# Patient Record
Sex: Male | Born: 1945 | Race: White | Hispanic: No | Marital: Married | Smoking: Former smoker
Health system: Southern US, Community
[De-identification: ages and names within clinical notes are randomized; demographics above are authoritative.]

## PROBLEM LIST (undated history)

## (undated) DIAGNOSIS — I10 Essential (primary) hypertension: Secondary | ICD-10-CM

## (undated) DIAGNOSIS — E7800 Pure hypercholesterolemia, unspecified: Secondary | ICD-10-CM

## (undated) HISTORY — PX: PROSTATE SURGERY: SHX751

---

## 2011-07-29 ENCOUNTER — Encounter (HOSPITAL_BASED_OUTPATIENT_CLINIC_OR_DEPARTMENT_OTHER): Payer: Self-pay | Admitting: *Deleted

## 2011-07-29 ENCOUNTER — Emergency Department (HOSPITAL_BASED_OUTPATIENT_CLINIC_OR_DEPARTMENT_OTHER)
Admission: EM | Admit: 2011-07-29 | Discharge: 2011-07-29 | Disposition: A | Discharge status: Home / Self Care | Payer: Managed Care, Other (non HMO) | Attending: Emergency Medicine | Admitting: Emergency Medicine

## 2011-07-29 DIAGNOSIS — K5641 Fecal impaction: Secondary | ICD-10-CM | POA: Insufficient documentation

## 2011-07-29 DIAGNOSIS — K644 Residual hemorrhoidal skin tags: Secondary | ICD-10-CM | POA: Insufficient documentation

## 2011-07-29 DIAGNOSIS — K625 Hemorrhage of anus and rectum: Secondary | ICD-10-CM

## 2011-07-29 MED ORDER — PHOSPHATE ENEMA 7-19 GM/118ML RE ENEM: 1.0000 | ENEMA | Freq: Once | RECTAL | Status: AC | PRN

## 2011-07-29 MED ORDER — FLEET ENEMA 7-19 GM/118ML RE ENEM
1.0000 | ENEMA | Freq: Once | RECTAL | Status: AC
  Administered 2011-07-29: 1 via RECTAL
  Filled 2011-07-29: qty 1

## 2011-07-29 MED ORDER — FLEET ENEMA 7-19 GM/118ML RE ENEM
1.0000 | ENEMA | Freq: Once | RECTAL | Status: DC
  Filled 2011-07-29: qty 1

## 2011-07-29 MED ORDER — DOCUSATE SODIUM 100 MG PO CAPS: 100.0000 mg | ORAL_CAPSULE | Freq: Two times a day (BID) | ORAL | Status: AC

## 2011-07-29 NOTE — ED Notes (Signed)
Pt presnts to ED today with rectal bleeding that began this am.  Pt has hx of same.

## 2011-07-29 NOTE — ED Notes (Signed)
MD at bedside attempting to disimpact pt.

## 2011-07-29 NOTE — ED Notes (Signed)
Pt presnets with BRBPR that started this am.  Pt took Miralaz last night and takes Hydrocodone on a regular basis.  Pt c.o mild abd pain and states that he "very uncomfortable"  Pt unable to sit in triage.

## 2011-07-29 NOTE — ED Provider Notes (Signed)
History     CSN: [id number]  Arrival date & time 07/29/11  7829   First MD Initiated Contact with Patient 07/29/11 [phone]      Chief Complaint  Patient presents with  . Rectal Bleeding    (Consider location/radiation/quality/duration/timing/severity/associated sxs/prior treatment) HPI  66yo distress hemorrhoids since with rectal bleeding and rectal pain. The patient states that he has rectal fullness. He complains of constipation. His last bowel movement was yesterday which he describes as tiny pebbles. He states he woke up this morning with bright red blood dripping into the toilet after he was straining to use the bathroom. He states his rectal fullness became worse. He denies abdominal pain, bloating. He is passing gas. He takes hydrocodone daily which she states is for diffuse body aches related to working out every day. He has taken for approximately one year. He describes a low fiber diet. Denies h/o obstruction. Denies N/V  Past Medical History  Diagnosis Date  . Hemorrhoids     Past Surgical History  Procedure Date  . Prostate surgery     History reviewed. No pertinent family history.  History  Substance Use Topics  . Smoking status: Not on file  . Smokeless tobacco: Not on file  . Alcohol Use:     OB History    Grav Para Term Preterm Abortions TAB SAB Ect Mult Living                  Review of Systems  All other systems reviewed and are negative.  except as noted HPI   Allergies  Review of patient's allergies indicates no known allergies.  Home Medications   Current Outpatient Rx  Name Route Sig Dispense Refill  . HYDROCODONE-ACETAMINOPHEN 10-325 MG PO TABS Oral Take 1 tablet by mouth every 6 (six) hours as needed.    Marland Kitchen DOCUSATE SODIUM 100 MG PO CAPS Oral Take 1 capsule (100 mg total) by mouth every 12 (twelve) hours. 60 capsule 0  . PHOSPHATE ENEMA 7-19 GM/118ML RE ENEM Rectal Place 1 enema rectally once as needed for constipation. 135 mL 0    BP  148/95  Pulse 98  Temp 97.8 F (36.6 C)  Resp 20  SpO2 100%  Physical Exam  Nursing note and vitals reviewed. Constitutional: She is oriented to person, place, and time. She appears well-developed.  HENT:  Head: Atraumatic.  Mouth/Throat: Oropharynx is clear and moist.  Eyes: Conjunctivae and EOM are normal. Pupils are equal, round, and reactive to light.  Neck: Normal range of motion. Neck supple.  Cardiovascular: Normal rate, regular rhythm, normal heart sounds and intact distal pulses.   Pulmonary/Chest: Effort normal and breath sounds normal. No respiratory distress. She has no wheezes. She has no rales.  Abdominal: Soft. She exhibits no distension. There is no tenderness. There is no rebound and no guarding.  Genitourinary:       +bleeding ext hemorrhoid no anal fissure +impacted stool rectal vault  Musculoskeletal: Normal range of motion.  Neurological: She is alert and oriented to person, place, and time.  Skin: Skin is warm and dry. No rash noted.  Psychiatric: She has a normal mood and affect.    ED Course  Procedures (including critical care time)  Stool disimpaction  Patient in R lateral recumbent position Stool gently removed digitally from rectal vault  Labs Reviewed - No data to display No results found.   1. Fecal impaction   2. Rectal bleeding   3. External hemorrhoid  MDM  Impacted stool s/p enema and disimpaction. Large BM in ED and feeling better. Will discharge home with colace, fleet enema, pmd f/u to discuss chronic narcotic use.         Forbes Cellar, MD 07/29/11 [phone]

## 2011-07-29 NOTE — ED Notes (Signed)
Pt sts he was able to have significant bowel movement and "feels much better". Pt dressed and ready to go home.

## 2014-03-19 ENCOUNTER — Emergency Department (HOSPITAL_BASED_OUTPATIENT_CLINIC_OR_DEPARTMENT_OTHER): Payer: BC Managed Care – PPO

## 2014-03-19 ENCOUNTER — Emergency Department (HOSPITAL_BASED_OUTPATIENT_CLINIC_OR_DEPARTMENT_OTHER)
Admission: EM | Admit: 2014-03-19 | Discharge: 2014-03-19 | Disposition: A | Discharge status: Home / Self Care | Payer: BC Managed Care – PPO | Attending: Emergency Medicine | Admitting: Emergency Medicine

## 2014-03-19 ENCOUNTER — Encounter (HOSPITAL_BASED_OUTPATIENT_CLINIC_OR_DEPARTMENT_OTHER): Payer: Self-pay | Admitting: Emergency Medicine

## 2014-03-19 DIAGNOSIS — M1120 Other chondrocalcinosis, unspecified site: Secondary | ICD-10-CM | POA: Diagnosis not present

## 2014-03-19 DIAGNOSIS — Z8679 Personal history of other diseases of the circulatory system: Secondary | ICD-10-CM | POA: Insufficient documentation

## 2014-03-19 DIAGNOSIS — M659 Unspecified synovitis and tenosynovitis, unspecified site: Secondary | ICD-10-CM | POA: Insufficient documentation

## 2014-03-19 DIAGNOSIS — Z87891 Personal history of nicotine dependence: Secondary | ICD-10-CM | POA: Insufficient documentation

## 2014-03-19 DIAGNOSIS — M79609 Pain in unspecified limb: Secondary | ICD-10-CM | POA: Diagnosis present

## 2014-03-19 DIAGNOSIS — M779 Enthesopathy, unspecified: Secondary | ICD-10-CM

## 2014-03-19 LAB — D-DIMER, QUANTITATIVE: D-Dimer, Quant: 0.32 ug/mL-FEU (ref 0.00–0.48)

## 2014-03-19 LAB — CBC WITH DIFFERENTIAL/PLATELET
BASOS ABS: 0 10*3/uL (ref 0.0–0.1)
Basophils Relative: 0 % (ref 0–1)
EOS PCT: 3 % (ref 0–5)
Eosinophils Absolute: 0.2 10*3/uL (ref 0.0–0.7)
HEMATOCRIT: 37.7 % — AB (ref 39.0–52.0)
Hemoglobin: 12.4 g/dL — ABNORMAL LOW (ref 13.0–17.0)
LYMPHS PCT: 32 % (ref 12–46)
Lymphs Abs: 2.2 10*3/uL (ref 0.7–4.0)
MCH: 29.5 pg (ref 26.0–34.0)
MCHC: 32.9 g/dL (ref 30.0–36.0)
MCV: 89.5 fL (ref 78.0–100.0)
MONO ABS: 0.6 10*3/uL (ref 0.1–1.0)
Monocytes Relative: 9 % (ref 3–12)
Neutro Abs: 3.8 10*3/uL (ref 1.7–7.7)
Neutrophils Relative %: 56 % (ref 43–77)
Platelets: 305 10*3/uL (ref 150–400)
RBC: 4.21 MIL/uL — ABNORMAL LOW (ref 4.22–5.81)
RDW: 12.9 % (ref 11.5–15.5)
WBC: 6.8 10*3/uL (ref 4.0–10.5)

## 2014-03-19 MED ORDER — METHOCARBAMOL 500 MG PO TABS
1000.0000 mg | ORAL_TABLET | Freq: Once | ORAL | Status: AC
  Administered 2014-03-19: 1000 mg via ORAL
  Filled 2014-03-19: qty 2

## 2014-03-19 MED ORDER — MELOXICAM 15 MG PO TABS: 15.0000 mg | ORAL_TABLET | Freq: Every day | ORAL | Status: DC

## 2014-03-19 MED ORDER — PREDNISONE 10 MG PO TABS: 20.0000 mg | ORAL_TABLET | Freq: Every day | ORAL | Status: DC

## 2014-03-19 NOTE — ED Notes (Signed)
Rt upper leg pain in front radiating into shin,  No known inj, no bruising denies swelling

## 2014-03-19 NOTE — ED Notes (Signed)
Rt upper leg pain x 4 days  Radiating into shin,  Denies inj, swelling or bruising

## 2014-03-19 NOTE — ED Provider Notes (Signed)
CSN: [id number]     Arrival date & time 03/19/14  0500 History   First MD Initiated Contact with Patient 03/19/14 [phone]     Chief Complaint  Patient presents with  . Leg Pain     (Consider location/radiation/quality/duration/timing/severity/associated sxs/prior Treatment) Patient is a 68 y.o. male presenting with leg pain. The history is provided by the patient. No language interpreter was used.  Leg Pain Lower extremity pain location: thigh now to the shin anteriorly. Lower extremity injury: works out daily but doesn't think it affected it.   Pain details:    Quality:  Aching   Radiates to:  Does not radiate   Severity:  Severe   Onset quality:  Gradual   Timing:  Constant   Progression:  Unchanged Chronicity:  New Dislocation: no   Foreign body present:  No foreign bodies Relieved by:  Nothing Worsened by:  Nothing tried Ineffective treatments:  None tried Associated symptoms: no back pain, no decreased ROM, no muscle weakness, no stiffness and no swelling   Risk factors: no concern for non-accidental trauma     Past Medical History  Diagnosis Date  . Hemorrhoids    Past Surgical History  Procedure Laterality Date  . Prostate surgery     No family history on file. History  Substance Use Topics  . Smoking status: Former Games developer  . Smokeless tobacco: Not on file  . Alcohol Use: No    Review of Systems  Musculoskeletal: Negative for back pain and stiffness.  All other systems reviewed and are negative.     Allergies  Review of patient's allergies indicates no known allergies.  Home Medications   Prior to Admission medications   Medication Sig Start Date End Date Taking? Authorizing Provider  HYDROcodone-acetaminophen (NORCO) 10-325 MG per tablet Take 1 tablet by mouth every 6 (six) hours as needed.    Historical Provider, MD   BP 146/87  Pulse 79  Temp(Src) 98.3 F (36.8 C) (Oral)  Resp 18  Ht 6' (1.829 m)  Wt 200 lb (90.719 kg)  BMI 27.12 kg/m2   SpO2 97% Physical Exam  Constitutional: He is oriented to person, place, and time. He appears well-developed and well-nourished. No distress.  HENT:  Head: Normocephalic and atraumatic.  Mouth/Throat: Oropharynx is clear and moist.  Eyes: Conjunctivae are normal. Pupils are equal, round, and reactive to light.  Neck: Normal range of motion. Neck supple.  Cardiovascular: Normal rate and intact distal pulses.   Pulmonary/Chest: Effort normal and breath sounds normal. He has no wheezes. He has no rales.  Abdominal: Soft. Bowel sounds are normal. There is no tenderness. There is no rebound and no guarding.  Musculoskeletal: Normal range of motion. He exhibits no edema and no tenderness.       Right knee: He exhibits normal range of motion, no swelling, no effusion, no ecchymosis, no deformity, no laceration, no erythema, normal alignment, no LCL laxity, normal patellar mobility, no bony tenderness, normal meniscus and no MCL laxity. No tenderness found. No medial joint line, no lateral joint line, no MCL, no LCL and no patellar tendon tenderness noted.       Right upper leg: Normal. He exhibits no tenderness, no bony tenderness, no swelling, no edema, no deformity and no laceration.       Right lower leg: Normal. He exhibits no tenderness, no bony tenderness, no swelling, no edema, no deformity and no laceration.  Negative anterior and posterior drawer tests all compartments of the upper and lower  leg are soft 5/5 RLE strength 3+ dorsalis pedis on the right.    Neurological: He is alert and oriented to person, place, and time. He has normal reflexes.  Skin: Skin is warm and dry.  Psychiatric: He has a normal mood and affect.    ED Course  Procedures (including critical care time) Labs Review Labs Reviewed  CBC WITH DIFFERENTIAL - Abnormal; Notable for the following:    RBC 4.21 (*)    Hemoglobin 12.4 (*)    HCT 37.7 (*)    All other components within normal limits  D-DIMER, QUANTITATIVE     Imaging Review No results found.   EKG Interpretation None      MDM   Final diagnoses:  None   Negative ddimer, very lower risk for DVT.  Muscle strain.  Will treat with muscle relaxants ice and elevation and close follow up with your PMD   Glory Graefe K Asa Fath-Rasch, MD 03/19/14 0600

## 2014-03-21 ENCOUNTER — Emergency Department (HOSPITAL_BASED_OUTPATIENT_CLINIC_OR_DEPARTMENT_OTHER)
Admission: EM | Admit: 2014-03-21 | Discharge: 2014-03-21 | Disposition: A | Discharge status: Home / Self Care | Payer: Medicare Other | Attending: Emergency Medicine | Admitting: Emergency Medicine

## 2014-03-21 ENCOUNTER — Encounter (HOSPITAL_BASED_OUTPATIENT_CLINIC_OR_DEPARTMENT_OTHER): Payer: Self-pay | Admitting: Emergency Medicine

## 2014-03-21 DIAGNOSIS — IMO0002 Reserved for concepts with insufficient information to code with codable children: Secondary | ICD-10-CM | POA: Insufficient documentation

## 2014-03-21 DIAGNOSIS — M79604 Pain in right leg: Secondary | ICD-10-CM

## 2014-03-21 DIAGNOSIS — M79609 Pain in unspecified limb: Secondary | ICD-10-CM | POA: Insufficient documentation

## 2014-03-21 DIAGNOSIS — Z791 Long term (current) use of non-steroidal anti-inflammatories (NSAID): Secondary | ICD-10-CM | POA: Insufficient documentation

## 2014-03-21 DIAGNOSIS — Z8679 Personal history of other diseases of the circulatory system: Secondary | ICD-10-CM | POA: Insufficient documentation

## 2014-03-21 DIAGNOSIS — Z87891 Personal history of nicotine dependence: Secondary | ICD-10-CM | POA: Insufficient documentation

## 2014-03-21 MED ORDER — NAPROXEN 375 MG PO TABS: 375.0000 mg | ORAL_TABLET | Freq: Two times a day (BID) | ORAL | Status: DC

## 2014-03-21 MED ORDER — HYDROCODONE-ACETAMINOPHEN 5-325 MG PO TABS: 1.0000 | ORAL_TABLET | ORAL | Status: DC | PRN

## 2014-03-21 NOTE — ED Notes (Signed)
Patient states that he started to hurt again with teh same pain that he had a few night ago. The pain was so bad it woke him up in the middle of the night. The patient reports that he is having pain to his upper leg and lower leg.

## 2014-03-21 NOTE — Discharge Instructions (Signed)
Followup with your own doctor to discuss formal ultrasound to look for small blood clot. Followup with local orthopedic for assessment of pain. For severe pain take norco or vicodin however realize they have the potential for addiction and it can make you sleepy and has tylenol in it.  No operating machinery while taking.  If you were given medicines take as directed.  If you are on coumadin or contraceptives realize their levels and effectiveness is altered by many different medicines.  If you have any reaction (rash, tongues swelling, other) to the medicines stop taking and see a physician.   Please follow up as directed and return to the ER or see a physician for new or worsening symptoms.  Thank you. Filed Vitals:   03/21/14 0339  BP: 155/82  Pulse: 74  Temp: 97.7 F (36.5 C)  TempSrc: Oral  Resp: 18  Weight: 200 lb (90.719 kg)  SpO2: 100%

## 2014-03-21 NOTE — ED Provider Notes (Signed)
CSN: [id number]     Arrival date & time 03/21/14  0331 History   First MD Initiated Contact with Patient 03/21/14 [phone]     Chief Complaint  Patient presents with  . Leg Pain     (Consider location/radiation/quality/duration/timing/severity/associated sxs/prior Treatment) HPI Comments: 68 year old male with no significant medical history presents with recurrent right upper and lower leg pain that woke him up at night. Patient was seen recently and had x-ray, d-dimer and CBC which were all unremarkable. Patient said the medicines that were given to improve pain significantly however tonight the pain returned. Patient denies any vascular history, no vascular risk factors, no recent travel, no blood clot history, no leg swelling, no active cancer, no shortness of breath or chest pain, no fevers or chills, no cold sensation in his leg, no back pain or leg weakness.  Patient is a 68 y.o. male presenting with leg pain. The history is provided by the patient.  Leg Pain Associated symptoms: no back pain, no fever and no neck pain     Past Medical History  Diagnosis Date  . Hemorrhoids    Past Surgical History  Procedure Laterality Date  . Prostate surgery     History reviewed. No pertinent family history. History  Substance Use Topics  . Smoking status: Former Games developer  . Smokeless tobacco: Not on file  . Alcohol Use: No    Review of Systems  Constitutional: Negative for fever and chills.  HENT: Negative for congestion.   Eyes: Negative for visual disturbance.  Respiratory: Negative for shortness of breath.   Cardiovascular: Negative for chest pain and leg swelling.  Gastrointestinal: Negative for vomiting and abdominal pain.  Genitourinary: Negative for dysuria and flank pain.  Musculoskeletal: Negative for back pain, neck pain and neck stiffness.  Skin: Negative for rash.  Neurological: Negative for light-headedness and headaches.      Allergies  Review of patient's allergies  indicates no known allergies.  Home Medications   Prior to Admission medications   Medication Sig Start Date End Date Taking? Authorizing Provider  HYDROcodone-acetaminophen (NORCO) 10-325 MG per tablet Take 1 tablet by mouth every 6 (six) hours as needed.    Historical Provider, MD  HYDROcodone-acetaminophen (NORCO) 5-325 MG per tablet Take 1-2 tablets by mouth every 4 (four) hours as needed. 03/21/14   Enid Skeens, MD  meloxicam (MOBIC) 15 MG tablet Take 1 tablet (15 mg total) by mouth daily. 03/19/14   April K Palumbo-Rasch, MD  naproxen (NAPROSYN) 375 MG tablet Take 1 tablet (375 mg total) by mouth 2 (two) times daily. 03/21/14   Enid Skeens, MD  predniSONE (DELTASONE) 10 MG tablet Take 2 tablets (20 mg total) by mouth daily. 03/19/14   April K Palumbo-Rasch, MD   BP 155/82  Pulse 74  Temp(Src) 97.7 F (36.5 C) (Oral)  Resp 18  Wt 200 lb (90.719 kg)  SpO2 100% Physical Exam  Nursing note and vitals reviewed. Constitutional: He is oriented to person, place, and time. He appears well-developed and well-nourished.  HENT:  Head: Normocephalic and atraumatic.  Eyes: Right eye exhibits no discharge. Left eye exhibits no discharge.  Neck: Normal range of motion. Neck supple. No tracheal deviation present.  Cardiovascular: Normal rate.   Pulmonary/Chest: Effort normal.  Abdominal: Soft.  Musculoskeletal: He exhibits tenderness. He exhibits no edema.  Patient has mild tenderness medial aspect of right proximal thigh and anterior proximal tibia without signs of infection. Patient has no tenderness with hip flexion or thigh  adduction. Patient has 2+ dorsalis pedis and posterior tibial pulses on the right leg. No calf tenderness. No knee swelling, full range of motion of the knee without instability.  Neurological: He is alert and oriented to person, place, and time.  Skin: Skin is warm. No rash noted.  Psychiatric: He has a normal mood and affect.    ED Course  Procedures (including  critical care time) Emergency Ultrasound Study:  Limited Duplex of right lower extremity veins  Performed by Dr. Jodi Mourning Indication: leg pain and/or swelling Visualization of saphenous-femoral junction, proximal femoral vein and popliteal vein regions in transverse plane with full compression visualized.  Interpretation  no dvt visualized.  Images archived electronically.       Labs Review Labs Reviewed - No data to display  Imaging Review Dg Knee Complete 4 Views Right  03/19/2014   CLINICAL DATA:  Right knee pain for 4 days.  EXAM: RIGHT KNEE - COMPLETE 4+ VIEW  COMPARISON:  None.  FINDINGS: Degenerative changes in the right knee with calcification in the medial and lateral compartments consistent with chondrocalcinosis. No evidence of acute fracture or dislocation. No focal bone lesion or bone destruction. No significant effusion.  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  No acute bony abnormalities.   Electronically Signed   By: Burman Nieves M.D.   On: 03/19/2014 06:25     EKG Interpretation None      MDM   Final diagnoses:  Right leg pain   Patient with recurrent right thigh and anterior tibia tenderness. Patient is very low risk blood clot and low risk vascular. No signs of acute ischemia, bedside ultrasound done and no significant blood clot seen full compression at multiple points along femoral vein, negative d-dimer yesterday. Possibly musculoskeletal in origin. Discussed having formal ultrasound done outpatient and reassessment by primary Dr. tomorrow. Few pain meds for home. Results and differential diagnosis were discussed with the patient/parent/guardian. Close follow up outpatient was discussed, comfortable with the plan.   Medications - No data to display  Filed Vitals:   03/21/14 0339  BP: 155/82  Pulse: 74  Temp: 97.7 F (36.5 C)  TempSrc: Oral  Resp: 18  Weight: 200 lb (90.719 kg)  SpO2: 100%    Final diagnoses:  Right leg pain         Enid Skeens, MD 03/21/14 [phone]

## 2017-06-04 ENCOUNTER — Other Ambulatory Visit: Payer: Self-pay

## 2017-06-04 ENCOUNTER — Encounter (HOSPITAL_BASED_OUTPATIENT_CLINIC_OR_DEPARTMENT_OTHER): Payer: Self-pay | Admitting: *Deleted

## 2017-06-04 ENCOUNTER — Emergency Department (HOSPITAL_BASED_OUTPATIENT_CLINIC_OR_DEPARTMENT_OTHER)
Admission: EM | Admit: 2017-06-04 | Discharge: 2017-06-04 | Disposition: A | Discharge status: Home / Self Care | Payer: BLUE CROSS/BLUE SHIELD | Attending: Emergency Medicine | Admitting: Emergency Medicine

## 2017-06-04 DIAGNOSIS — Z5321 Procedure and treatment not carried out due to patient leaving prior to being seen by health care provider: Secondary | ICD-10-CM | POA: Insufficient documentation

## 2017-06-04 DIAGNOSIS — R51 Headache: Secondary | ICD-10-CM | POA: Insufficient documentation

## 2017-06-04 HISTORY — DX: Essential (primary) hypertension: I10

## 2017-06-04 NOTE — ED Triage Notes (Signed)
Pt c/o h/a and increased BP x 2 days

## 2017-06-04 NOTE — ED Notes (Signed)
Pt called to treatment room with no answer from lobby.  

## 2017-06-04 NOTE — ED Notes (Signed)
Called to treatment room with no answer from lobby 

## 2017-06-11 ENCOUNTER — Encounter (HOSPITAL_BASED_OUTPATIENT_CLINIC_OR_DEPARTMENT_OTHER): Payer: Self-pay | Admitting: Emergency Medicine

## 2017-06-11 ENCOUNTER — Other Ambulatory Visit: Payer: Self-pay

## 2017-06-11 ENCOUNTER — Emergency Department (HOSPITAL_BASED_OUTPATIENT_CLINIC_OR_DEPARTMENT_OTHER)
Admission: EM | Admit: 2017-06-11 | Discharge: 2017-06-11 | Disposition: A | Discharge status: Home / Self Care | Payer: BLUE CROSS/BLUE SHIELD | Attending: Emergency Medicine | Admitting: Emergency Medicine

## 2017-06-11 DIAGNOSIS — Z87891 Personal history of nicotine dependence: Secondary | ICD-10-CM | POA: Diagnosis not present

## 2017-06-11 DIAGNOSIS — Z760 Encounter for issue of repeat prescription: Secondary | ICD-10-CM | POA: Insufficient documentation

## 2017-06-11 DIAGNOSIS — I10 Essential (primary) hypertension: Secondary | ICD-10-CM | POA: Diagnosis not present

## 2017-06-11 MED ORDER — METOPROLOL TARTRATE 25 MG PO TABS: 25.0000 mg | ORAL_TABLET | Freq: Two times a day (BID) | ORAL | 0 refills | Status: AC

## 2017-06-11 MED ORDER — METOPROLOL TARTRATE 50 MG PO TABS
25.0000 mg | ORAL_TABLET | Freq: Once | ORAL | Status: AC
  Administered 2017-06-11: 25 mg via ORAL
  Filled 2017-06-11: qty 1

## 2017-06-11 MED FILL — METOPROLOL TARTRATE 25 MG T: 25 | 30 days supply | Qty: 60 | Fill #0

## 2017-06-11 NOTE — ED Triage Notes (Signed)
Pt states he has HTN.  Pt was seen at PCP one month ago, started on metoprolol for elevated BP.  Pt went to drugstore and noticed that his BP is still high.  Pt has appointment tomorrow but wanted it evaluated today.

## 2017-06-11 NOTE — ED Provider Notes (Signed)
[facility] EMERGENCY DEPARTMENT Provider Note   CSN: [id number] Arrival date & time: 06/11/17  [id number]     History   Chief Complaint Chief Complaint  Patient presents with  . Hypertension    HPI Ronald Vazquez is a 71 y.o. male.  HPI Patient states he ran out of his metoprolol 4 days ago.  He has been taking the perindopril as prescribed.  States he felt that his blood pressure may be high.  Took his blood pressure at the pharmacy.  Has an appointment to follow-up with his primary physician tomorrow.  He denies headache, vision changes, chest pain, shortness of breath, nausea or vomiting. Past Medical History:  Diagnosis Date  . Hemorrhoids   . Hypertension     There are no active problems to display for this patient.   Past Surgical History:  Procedure Laterality Date  . PROSTATE SURGERY         Home Medications    Prior to Admission medications   Medication Sig Start Date End Date Taking? Authorizing Provider  metoprolol tartrate (LOPRESSOR) 25 MG tablet Take 1 tablet (25 mg total) by mouth 2 (two) times daily. 06/11/17   Loren RacerYelverton, Jayceion Lisenby, MD  perindopril (ACEON) 4 MG tablet Take 8 mg by mouth daily.    [provider]    Family History No family history on file.  Social History Social History   Tobacco Use  . Smoking status: Former Games developermoker  . Smokeless tobacco: Never Used  Substance Use Topics  . Alcohol use: No  . Drug use: No     Allergies   Patient has no known allergies.   Review of Systems Review of Systems  Constitutional: Negative for chills and fever.  Eyes: Negative for visual disturbance.  Respiratory: Negative for cough and shortness of breath.   Cardiovascular: Negative for chest pain, palpitations and leg swelling.  Gastrointestinal: Negative for abdominal pain, diarrhea, nausea and vomiting.  Musculoskeletal: Negative for back pain, myalgias, neck pain and neck stiffness.  Skin: Negative for rash and wound.    Neurological: Negative for dizziness, weakness, light-headedness, numbness and headaches.  All other systems reviewed and are negative.    Physical Exam Updated Vital Signs BP (!) 172/107 (BP Location: Left Arm)   Pulse (!) 103   Temp 98.7 F (37.1 C) (Oral)   Resp 18   SpO2 99%   Physical Exam  Constitutional: He is oriented to person, place, and time. He appears well-developed and well-nourished.  HENT:  Head: Normocephalic and atraumatic.  Mouth/Throat: Oropharynx is clear and moist.  Eyes: EOM are normal. Pupils are equal, round, and reactive to light.  Neck: Normal range of motion. Neck supple.  Cardiovascular: Normal rate and regular rhythm. Exam reveals no gallop and no friction rub.  No murmur heard. Pulmonary/Chest: Effort normal and breath sounds normal.  Abdominal: Soft. Bowel sounds are normal. There is no tenderness. There is no rebound and no guarding.  Musculoskeletal: Normal range of motion. He exhibits no edema or tenderness.  Neurological: He is alert and oriented to person, place, and time.  5/5 motor in all extremities.  Sensation fully intact.  Ambulating without difficulty.  Skin: Skin is warm and dry. No rash noted. No erythema.  Psychiatric: He has a normal mood and affect. His behavior is normal.  Nursing note and vitals reviewed.    ED Treatments / Results  Labs (all labs ordered are listed, but only abnormal results are displayed) Labs Reviewed - No data  to display  EKG  EKG Interpretation None       Radiology No results found.  Procedures Procedures (including critical care time)  Medications Ordered in ED Medications  metoprolol tartrate (LOPRESSOR) tablet 25 mg (not administered)     Initial Impression / Assessment and Plan / ED Course  I have reviewed the triage vital signs and the nursing notes.  Pertinent labs & imaging results that were available during my care of the patient were reviewed by me and considered in my  medical decision making (see chart for details).     Hypertension tachycardia likely due to rebound effect from not taking the metoprolol for the past 4 days.  Will start back on metoprolol.  Advised to follow-up with his primary physician tomorrow to have blood pressure rechecked.  Return precautions given.  Final Clinical Impressions(s) / ED Diagnoses   Final diagnoses:  Hypertension, unspecified type  Medication refill    ED Discharge Orders        Ordered    metoprolol tartrate (LOPRESSOR) 25 MG tablet  2 times daily     06/11/17 1023       Loren RacerYelverton, Hendryx Ricke, MD 06/11/17 1039

## 2017-09-02 ENCOUNTER — Emergency Department (HOSPITAL_BASED_OUTPATIENT_CLINIC_OR_DEPARTMENT_OTHER)
Admission: EM | Admit: 2017-09-02 | Discharge: 2017-09-02 | Disposition: A | Discharge status: Home / Self Care | Payer: Medicare Other

## 2019-04-01 ENCOUNTER — Other Ambulatory Visit: Payer: Self-pay

## 2019-04-01 DIAGNOSIS — Z20822 Contact with and (suspected) exposure to covid-19: Secondary | ICD-10-CM

## 2019-04-03 LAB — NOVEL CORONAVIRUS, NAA: SARS-CoV-2, NAA: NOT DETECTED

## 2019-04-12 ENCOUNTER — Telehealth: Payer: Self-pay | Admitting: Family Medicine

## 2019-04-12 NOTE — Telephone Encounter (Signed)
Patient called and received his covid test result °

## 2019-07-26 ENCOUNTER — Encounter (HOSPITAL_BASED_OUTPATIENT_CLINIC_OR_DEPARTMENT_OTHER): Payer: Self-pay

## 2019-07-26 ENCOUNTER — Emergency Department (HOSPITAL_BASED_OUTPATIENT_CLINIC_OR_DEPARTMENT_OTHER)
Admission: EM | Admit: 2019-07-26 | Discharge: 2019-07-26 | Disposition: A | Discharge status: Home / Self Care | Payer: BC Managed Care – PPO | Attending: Emergency Medicine | Admitting: Emergency Medicine

## 2019-07-26 ENCOUNTER — Emergency Department (HOSPITAL_BASED_OUTPATIENT_CLINIC_OR_DEPARTMENT_OTHER): Payer: BC Managed Care – PPO

## 2019-07-26 ENCOUNTER — Other Ambulatory Visit: Payer: Self-pay

## 2019-07-26 DIAGNOSIS — R05 Cough: Secondary | ICD-10-CM | POA: Diagnosis present

## 2019-07-26 DIAGNOSIS — Z87891 Personal history of nicotine dependence: Secondary | ICD-10-CM | POA: Insufficient documentation

## 2019-07-26 DIAGNOSIS — I10 Essential (primary) hypertension: Secondary | ICD-10-CM | POA: Diagnosis not present

## 2019-07-26 DIAGNOSIS — U071 COVID-19: Secondary | ICD-10-CM

## 2019-07-26 DIAGNOSIS — Z79899 Other long term (current) drug therapy: Secondary | ICD-10-CM | POA: Diagnosis not present

## 2019-07-26 LAB — SARS CORONAVIRUS 2 AG (30 MIN TAT): SARS Coronavirus 2 Ag: POSITIVE — AB

## 2019-07-26 NOTE — ED Triage Notes (Signed)
Pt arrives ambulatory to ED with reports of fever and cough. States that he works in [employer] and is unsure if he has been around anyone with Covid or not. Pt reports cough "for some weeks" and "fever starting Saturday".

## 2019-07-26 NOTE — ED Notes (Signed)
ED Provider at bedside. 

## 2019-07-26 NOTE — ED Provider Notes (Signed)
[facility] EMERGENCY DEPARTMENT Provider Note   CSN: [id number] Arrival date & time: 07/26/19  7510     History Chief Complaint  Patient presents with  . Fever  . Cough    Ronald Vazquez is a 74 y.o. male.  HPI 74 year old male presents with cough and low-grade fever.  He states he is been having a cough for a month or more.  However the cough has worsened starting 2 days ago along with low-grade fever up to 100.  Also some mild body aches and fatigue.  No shortness of breath.  No vomiting.  Thinks he has had some Covid contacts at work.  Past Medical History:  Diagnosis Date  . Hemorrhoids   . Hypertension     There are no problems to display for this patient.   Past Surgical History:  Procedure Laterality Date  . PROSTATE SURGERY         No family history on file.  Social History   Tobacco Use  . Smoking status: Former Research scientist (life sciences)  . Smokeless tobacco: Never Used  Substance Use Topics  . Alcohol use: No  . Drug use: No    Home Medications Prior to Admission medications   Medication Sig Start Date End Date Taking? Authorizing Provider  metoprolol tartrate (LOPRESSOR) 25 MG tablet Take 1 tablet (25 mg total) by mouth 2 (two) times daily. 06/11/17   Julianne Rice, MD  perindopril (ACEON) 4 MG tablet Take 8 mg by mouth daily.    [provider]    Allergies    Patient has no known allergies.  Review of Systems   Review of Systems  Constitutional: Positive for fever.  Respiratory: Positive for cough. Negative for shortness of breath.   Gastrointestinal: Negative for vomiting.  Musculoskeletal: Positive for myalgias.  All other systems reviewed and are negative.   Physical Exam Updated Vital Signs BP (!) 162/100 (BP Location: Right Arm)   Pulse 81   Temp 98.7 F (37.1 C) (Oral)   Resp 20   Ht 6' (1.829 m)   Wt 99.8 kg   SpO2 97%   BMI 29.84 kg/m   Physical Exam Vitals and nursing note reviewed.  Constitutional:    General: He is not in acute distress.    Appearance: He is well-developed. He is not ill-appearing or diaphoretic.  HENT:     Head: Normocephalic and atraumatic.     Right Ear: External ear normal.     Left Ear: External ear normal.     Nose: Nose normal.  Eyes:     General:        Right eye: No discharge.        Left eye: No discharge.  Cardiovascular:     Rate and Rhythm: Normal rate and regular rhythm.     Heart sounds: Normal heart sounds.  Pulmonary:     Effort: Pulmonary effort is normal.     Breath sounds: Normal breath sounds. No wheezing, rhonchi or rales.  Abdominal:     General: There is no distension.  Musculoskeletal:     Cervical back: Neck supple.  Skin:    General: Skin is warm and dry.  Neurological:     Mental Status: He is alert.  Psychiatric:        Mood and Affect: Mood is not anxious.     ED Results / Procedures / Treatments   Labs (all labs ordered are listed, but only abnormal results are displayed) Labs Reviewed  SARS  CORONAVIRUS 2 AG (30 MIN TAT) - Abnormal; Notable for the following components:      Result Value   SARS Coronavirus 2 Ag POSITIVE (*)    All other components within normal limits    EKG None  Radiology DG Chest Portable 1 View  Result Date: 07/26/2019 CLINICAL DATA:  Cough and fever EXAM: PORTABLE CHEST 1 VIEW COMPARISON:  None. FINDINGS: Mild linear opacity at the left base. Lung volumes are low. There is no edema, consolidation, effusion, or pneumothorax. Normal heart size and mediastinal contours. IMPRESSION: Mild atelectasis at the left base. Electronically Signed   By: Marnee Spring M.D.   On: 07/26/2019 10:21    Procedures Procedures (including critical care time)  Medications Ordered in ED Medications - No data to display  ED Course  I have reviewed the triage vital signs and the nursing notes.  Pertinent labs & imaging results that were available during my care of the patient were reviewed by me and considered  in my medical decision making (see chart for details).    MDM Rules/Calculators/A&P                      Patient's novel coronavirus test is positive.  This fits clinically.  Overall he is having a mild course and denies any significant shortness of breath and does not show any increased work of breathing or hypoxia.  Chest x-ray with mild atelectasis that could be developing pneumonia though at this point with only a couple days of symptoms it is highly unlikely he has concomitant or superimposed bacterial pneumonia.  We discussed return precautions and need to quarantine.  Will discharge home.  Ronald Vazquez was evaluated in Emergency Department on 07/26/2019 for the symptoms described in the history of present illness. He was evaluated in the context of the global COVID-19 pandemic, which necessitated consideration that the patient might be at risk for infection with the SARS-CoV-2 virus that causes COVID-19. Institutional protocols and algorithms that pertain to the evaluation of patients at risk for COVID-19 are in a state of rapid change based on information released by regulatory bodies including the CDC and federal and state organizations. These policies and algorithms were followed during the patient's care in the ED.  Final Clinical Impression(s) / ED Diagnoses Final diagnoses:  COVID-19 virus infection    Rx / DC Orders ED Discharge Orders    None       Pricilla Loveless, MD 07/26/19 1101

## 2019-07-26 NOTE — Discharge Instructions (Signed)
If you develop coughing up blood, vomiting, trouble breathing, chest pain, or any other new/concerning symptoms then return to the ER for evaluation.

## 2020-02-17 ENCOUNTER — Other Ambulatory Visit: Payer: Self-pay

## 2020-02-17 ENCOUNTER — Emergency Department (HOSPITAL_BASED_OUTPATIENT_CLINIC_OR_DEPARTMENT_OTHER)
Admission: EM | Admit: 2020-02-17 | Discharge: 2020-02-17 | Disposition: A | Discharge status: Home / Self Care | Payer: BC Managed Care – PPO | Attending: Emergency Medicine | Admitting: Emergency Medicine

## 2020-02-17 ENCOUNTER — Encounter (HOSPITAL_BASED_OUTPATIENT_CLINIC_OR_DEPARTMENT_OTHER): Payer: Self-pay | Admitting: *Deleted

## 2020-02-17 DIAGNOSIS — Z79899 Other long term (current) drug therapy: Secondary | ICD-10-CM | POA: Insufficient documentation

## 2020-02-17 DIAGNOSIS — Z760 Encounter for issue of repeat prescription: Secondary | ICD-10-CM | POA: Diagnosis not present

## 2020-02-17 DIAGNOSIS — Z79891 Long term (current) use of opiate analgesic: Secondary | ICD-10-CM | POA: Diagnosis not present

## 2020-02-17 DIAGNOSIS — I10 Essential (primary) hypertension: Secondary | ICD-10-CM

## 2020-02-17 DIAGNOSIS — R42 Dizziness and giddiness: Secondary | ICD-10-CM | POA: Diagnosis present

## 2020-02-17 MED ORDER — ENALAPRIL MALEATE 5 MG PO TABS
10.0000 mg | ORAL_TABLET | Freq: Once | ORAL | Status: AC
  Administered 2020-02-17: 10 mg via ORAL
  Filled 2020-02-17: qty 2

## 2020-02-17 MED ORDER — PERINDOPRIL ERBUMINE 4 MG PO TABS: 4.0000 mg | ORAL_TABLET | Freq: Every day | ORAL | 0 refills | Status: DC

## 2020-02-17 NOTE — ED Triage Notes (Signed)
Pt c/o increased bp, d/t out of Perindopril x 3 days

## 2020-02-17 NOTE — ED Provider Notes (Signed)
[facility] Provider Note: Ronald Dell, MD, FACEP  CSN: [id number] MRN: [id number] ARRIVAL: 02/17/20 at 2132 ROOM: [facility]07/[facility]   CHIEF COMPLAINT  Hypertension   HISTORY OF PRESENT ILLNESS  02/17/20 11:00 PM CEBASTIAN Vazquez is a 74 y.o. male who accidentally spilled his prescription for perindopril 4 mg down the toilet yesterday.  This morning he felt dizzy (lightheaded) but that was transient.  He has a mild headache as well.  He denies chest pain or shortness of breath.  He is here requesting a refill.  He is also on metoprolol but still has that.   Past Medical History:  Diagnosis Date  . Hemorrhoids   . Hypertension     Past Surgical History:  Procedure Laterality Date  . PROSTATE SURGERY      No family history on file.  Social History   Tobacco Use  . Smoking status: Former Games developer  . Smokeless tobacco: Never Used  Substance Use Topics  . Alcohol use: No  . Drug use: No    Prior to Admission medications   Medication Sig Start Date End Date Taking? Authorizing Provider  metoprolol tartrate (LOPRESSOR) 25 MG tablet Take 1 tablet (25 mg total) by mouth 2 (two) times daily. 06/11/17   Loren Racer, MD  perindopril (ACEON) 4 MG tablet Take 1 tablet (4 mg total) by mouth daily. 02/17/20   Rc Amison, Jonny Ruiz, MD    Allergies Patient has no known allergies.   REVIEW OF SYSTEMS  Negative except as noted here or in the History of Present Illness.   PHYSICAL EXAMINATION  Initial Vital Signs Blood pressure (!) 193/113, pulse 88, temperature 98.2 F (36.8 C), temperature source Oral, resp. rate 18, height 6' (1.829 m), weight 102.1 kg, SpO2 98 %.  Examination General: Well-developed, well-nourished male in no acute distress; appearance consistent with age of record HENT: normocephalic; atraumatic Eyes: pupils equal, round and reactive to light; extraocular muscles intact Neck: supple Heart: regular rate and rhythm Lungs: clear to auscultation  bilaterally Abdomen: soft; nondistended; nontender; bowel sounds present Extremities: No deformity; full range of motion; pulses normal Neurologic: Awake, alert and oriented; motor function intact in all extremities and symmetric; no facial droop Skin: Warm and dry Psychiatric: Normal mood and affect   RESULTS  Summary of this visit's results, reviewed and interpreted by myself:   EKG Interpretation  Date/Time:    Ventricular Rate:    PR Interval:    QRS Duration:   QT Interval:    QTC Calculation:   R Axis:     Text Interpretation:        Laboratory Studies: No results found for this or any previous visit (from the past 24 hour(s)). Imaging Studies: No results found.  ED COURSE and MDM  Nursing notes, initial and subsequent vitals signs, including pulse oximetry, reviewed and interpreted by myself.  Vitals:   02/17/20 2144 02/17/20 2145  BP: (!) 193/113   Pulse: 88   Resp: 18   Temp: 98.2 F (36.8 C)   TempSrc: Oral   SpO2: 98%   Weight:  102.1 kg  Height:  6' (1.829 m)   Medications  enalapril (VASOTEC) tablet 10 mg (has no administration in time range)    We will refill the patient's perindopril.  PROCEDURES  Procedures   ED DIAGNOSES     ICD-10-CM   1. Medication refill  Z76.0   2. Hypertension not at goal  I10        Tavien Chestnut, Jonny Ruiz, MD  02/17/20 2310  

## 2020-05-19 ENCOUNTER — Emergency Department (HOSPITAL_BASED_OUTPATIENT_CLINIC_OR_DEPARTMENT_OTHER)
Admission: EM | Admit: 2020-05-19 | Discharge: 2020-05-19 | Disposition: A | Discharge status: Home / Self Care | Payer: BC Managed Care – PPO | Attending: Emergency Medicine | Admitting: Emergency Medicine

## 2020-05-19 ENCOUNTER — Other Ambulatory Visit: Payer: Self-pay

## 2020-05-19 ENCOUNTER — Encounter (HOSPITAL_BASED_OUTPATIENT_CLINIC_OR_DEPARTMENT_OTHER): Payer: Self-pay | Admitting: Emergency Medicine

## 2020-05-19 DIAGNOSIS — Z79899 Other long term (current) drug therapy: Secondary | ICD-10-CM | POA: Insufficient documentation

## 2020-05-19 DIAGNOSIS — I10 Essential (primary) hypertension: Secondary | ICD-10-CM | POA: Diagnosis not present

## 2020-05-19 DIAGNOSIS — Z760 Encounter for issue of repeat prescription: Secondary | ICD-10-CM | POA: Diagnosis not present

## 2020-05-19 DIAGNOSIS — Z87891 Personal history of nicotine dependence: Secondary | ICD-10-CM | POA: Insufficient documentation

## 2020-05-19 HISTORY — DX: Pure hypercholesterolemia, unspecified: E78.00

## 2020-05-19 MED ORDER — PERINDOPRIL ERBUMINE 4 MG PO TABS: 4.0000 mg | ORAL_TABLET | Freq: Every day | ORAL | 0 refills | Status: AC

## 2020-05-19 MED ORDER — PERINDOPRIL ERBUMINE 4 MG PO TABS: 4.0000 mg | ORAL_TABLET | Freq: Every day | ORAL | 0 refills | Status: DC

## 2020-05-19 NOTE — Discharge Instructions (Addendum)
Refill for your blood pressure medicine was sent to the pharmacy.  Please take it as prescribed.  Follow-up with your primary care doctor within 1 week to have your blood pressure rechecked.  Return to the emergency department if you have any new or worsening symptoms including headache, visual changes, dizziness, chest pain or shortness of breath, numbness, tingling or weakness.

## 2020-05-19 NOTE — ED Triage Notes (Addendum)
Pt out of bp medication x week.  No symptoms.  Pt states BP at home 180/114.  Out of refills and md office is closed.  Pt needs aceon refilled.

## 2020-05-19 NOTE — ED Provider Notes (Signed)
[facility] EMERGENCY DEPARTMENT Provider Note   CSN: [id number] Arrival date & time: 05/19/20  1008     History Chief Complaint  Patient presents with  . Hypertension    Ronald Vazquez is a 74 y.o. male the past medical history significant for hyperlipidemia, hypertension.  HPI Patient presents to emergency department today with chief complaint of hypertension.  He states he ran out of his perindopril approximately 1 week ago.  He has been taking his metoprolol as prescribed still.  He tried calling his PCP office however they were closed for the holidays.  He is asymptomatic.  Denies any fever, chills, headache, blurry vision, chest pain, shortness of breath, back pain, numbness, weakness, tingling, dizziness.    Past Medical History:  Diagnosis Date  . Hemorrhoids   . High cholesterol   . Hypertension     There are no problems to display for this patient.   Past Surgical History:  Procedure Laterality Date  . PROSTATE SURGERY         No family history on file.  Social History   Tobacco Use  . Smoking status: Former Games developer  . Smokeless tobacco: Never Used  Substance Use Topics  . Alcohol use: No  . Drug use: No    Home Medications Prior to Admission medications   Medication Sig Start Date End Date Taking? Authorizing Provider  metoprolol tartrate (LOPRESSOR) 25 MG tablet Take 1 tablet (25 mg total) by mouth 2 (two) times daily. 06/11/17  Yes Loren Racer, MD  perindopril (ACEON) 4 MG tablet Take 1 tablet (4 mg total) by mouth daily. 05/19/20   Shanon Ace, PA-C    Allergies    Statins  Review of Systems   Review of Systems All other systems are reviewed and are negative for acute change except as noted in the HPI.  Physical Exam Updated Vital Signs BP (!) 198/110 (BP Location: Left Arm)   Pulse 69   Temp 97.7 F (36.5 C)   Resp 16   Ht 6' (1.829 m)   Wt 97.5 kg   SpO2 99%   BMI 29.16 kg/m   Physical Exam Vitals  and nursing note reviewed.  Constitutional:      Appearance: He is well-developed. He is not ill-appearing or toxic-appearing.  HENT:     Head: Normocephalic and atraumatic.     Nose: Nose normal.  Eyes:     General: No scleral icterus.       Right eye: No discharge.        Left eye: No discharge.     Conjunctiva/sclera: Conjunctivae normal.  Neck:     Vascular: No JVD.  Cardiovascular:     Rate and Rhythm: Normal rate and regular rhythm.     Pulses: Normal pulses.          Radial pulses are 2+ on the right side and 2+ on the left side.     Heart sounds: Normal heart sounds.  Pulmonary:     Effort: Pulmonary effort is normal.     Breath sounds: Normal breath sounds.  Abdominal:     General: There is no distension.  Musculoskeletal:        General: Normal range of motion.     Cervical back: Normal range of motion.  Skin:    General: Skin is warm and dry.  Neurological:     Mental Status: He is oriented to person, place, and time.     GCS: GCS eye subscore  is 4. GCS verbal subscore is 5. GCS motor subscore is 6.     Comments: Speech is clear and goal oriented, follows commands CN III-XII intact, no facial droop Normal strength in upper and lower extremities bilaterally including dorsiflexion and plantar flexion, strong and equal grip strength Sensation normal to light and sharp touch Moves extremities without ataxia, coordination intact Normal finger to nose and rapid alternating movements Normal gait and balance  Psychiatric:        Behavior: Behavior normal.     ED Results / Procedures / Treatments   Labs (all labs ordered are listed, but only abnormal results are displayed) Labs Reviewed - No data to display  EKG None  Radiology No results found.  Procedures Procedures (including critical care time)  Medications Ordered in ED Medications - No data to display  ED Course  I have reviewed the triage vital signs and the nursing notes.  Pertinent labs &  imaging results that were available during my care of the patient were reviewed by me and considered in my medical decision making (see chart for details).    MDM Rules/Calculators/A&P                          History provided by patient with additional history obtained from chart review.    Patient presenting for medication refill for his perindopril.  Has been out approximately 1 week.  Blood pressure is elevated however he has a normal neuro exam. He is asymptomatic, do not feel further emergent work up is needed at this time.  Refill sent to pharmacy.  Recommend he follow-up with PCP within 1 week to have blood pressure rechecked.  Strict return precautions discussed with patient. Findings and plan of care discussed with supervising physician Dr. Particia Nearing   Portions of this note were generated with Dragon dictation software. Dictation errors may occur despite best attempts at proofreading.   Final Clinical Impression(s) / ED Diagnoses Final diagnoses:  Medication refill    Rx / DC Orders ED Discharge Orders         Ordered    perindopril (ACEON) 4 MG tablet  Daily       Note to Pharmacy: Previous refill early, as this prescription was inadvertently lost   05/19/20 1047           Kandice Hams 05/19/20 1101    Jacalyn Lefevre, MD 05/19/20 1436

## 2020-05-23 ENCOUNTER — Emergency Department (HOSPITAL_BASED_OUTPATIENT_CLINIC_OR_DEPARTMENT_OTHER)
Admission: EM | Admit: 2020-05-23 | Discharge: 2020-05-23 | Disposition: A | Discharge status: Home / Self Care | Payer: BC Managed Care – PPO | Attending: Emergency Medicine | Admitting: Emergency Medicine

## 2020-05-23 ENCOUNTER — Other Ambulatory Visit: Payer: Self-pay

## 2020-05-23 ENCOUNTER — Encounter (HOSPITAL_BASED_OUTPATIENT_CLINIC_OR_DEPARTMENT_OTHER): Payer: Self-pay | Admitting: *Deleted

## 2020-05-23 DIAGNOSIS — Z79899 Other long term (current) drug therapy: Secondary | ICD-10-CM | POA: Insufficient documentation

## 2020-05-23 DIAGNOSIS — Z87891 Personal history of nicotine dependence: Secondary | ICD-10-CM | POA: Insufficient documentation

## 2020-05-23 DIAGNOSIS — I10 Essential (primary) hypertension: Secondary | ICD-10-CM | POA: Insufficient documentation

## 2020-05-23 DIAGNOSIS — R030 Elevated blood-pressure reading, without diagnosis of hypertension: Secondary | ICD-10-CM | POA: Diagnosis present

## 2020-05-23 NOTE — ED Provider Notes (Signed)
[facility] EMERGENCY DEPARTMENT Provider Note   CSN: [id number] Arrival date & time: 05/23/20  2106     History Chief Complaint  Patient presents with  . Hypertension    Ronald Vazquez is a 74 y.o. male.  The history is provided by the patient.  Hypertension This is a chronic problem. The problem has not changed since onset.Pertinent negatives include no chest pain, no abdominal pain, no headaches and no shortness of breath. Nothing aggravates the symptoms. Nothing relieves the symptoms. He has tried nothing for the symptoms. The treatment provided no relief.       Past Medical History:  Diagnosis Date  . Hemorrhoids   . High cholesterol   . Hypertension     There are no problems to display for this patient.   Past Surgical History:  Procedure Laterality Date  . PROSTATE SURGERY         No family history on file.  Social History   Tobacco Use  . Smoking status: Former Games developer  . Smokeless tobacco: Never Used  Substance Use Topics  . Alcohol use: No  . Drug use: No    Home Medications Prior to Admission medications   Medication Sig Start Date End Date Taking? Authorizing Provider  metoprolol tartrate (LOPRESSOR) 25 MG tablet Take 1 tablet (25 mg total) by mouth 2 (two) times daily. 06/11/17  Yes Loren Racer, MD  perindopril (ACEON) 4 MG tablet Take 1 tablet (4 mg total) by mouth daily. 05/19/20  Yes Walisiewicz, Yvonna Alanis E, PA-C    Allergies    Statins  Review of Systems   Review of Systems  Constitutional: Negative for chills and fever.  HENT: Negative for ear pain and sore throat.   Eyes: Negative for pain and visual disturbance.  Respiratory: Negative for cough and shortness of breath.   Cardiovascular: Negative for chest pain and palpitations.  Gastrointestinal: Negative for abdominal pain and vomiting.  Genitourinary: Negative for dysuria and hematuria.  Musculoskeletal: Negative for arthralgias and back pain.  Skin: Negative  for color change and rash.  Neurological: Negative for seizures, syncope and headaches.  All other systems reviewed and are negative.   Physical Exam Updated Vital Signs BP (!) 186/94   Pulse 73   Temp 97.7 F (36.5 C) (Oral)   Resp 14   Ht 6' (1.829 m)   Wt 97.5 kg   SpO2 97%   BMI 29.15 kg/m   Physical Exam Vitals and nursing note reviewed.  Constitutional:      General: He is not in acute distress.    Appearance: He is well-developed. He is not ill-appearing.  HENT:     Head: Normocephalic and atraumatic.  Eyes:     Extraocular Movements: Extraocular movements intact.     Conjunctiva/sclera: Conjunctivae normal.     Pupils: Pupils are equal, round, and reactive to light.  Cardiovascular:     Rate and Rhythm: Normal rate and regular rhythm.     Pulses: Normal pulses.     Heart sounds: No murmur heard.   Pulmonary:     Effort: Pulmonary effort is normal. No respiratory distress.     Breath sounds: Normal breath sounds.  Abdominal:     Palpations: Abdomen is soft.     Tenderness: There is no abdominal tenderness.  Musculoskeletal:     Cervical back: Neck supple.  Skin:    General: Skin is warm and dry.  Neurological:     General: No focal deficit present.  Mental Status: He is alert and oriented to person, place, and time.     Cranial Nerves: No cranial nerve deficit.     Sensory: No sensory deficit.     Motor: No weakness.     Gait: Gait normal.     Comments: 5+ out of 5 strength throughout, normal sensation, no drift, normal finger-nose-finger     ED Results / Procedures / Treatments   Labs (all labs ordered are listed, but only abnormal results are displayed) Labs Reviewed - No data to display  EKG None  Radiology No results found.  Procedures Procedures (including critical care time)  Medications Ordered in ED Medications - No data to display  ED Course  I have reviewed the triage vital signs and the nursing notes.  Pertinent labs &  imaging results that were available during my care of the patient were reviewed by me and considered in my medical decision making (see chart for details).    MDM Rules/Calculators/A&P                          Ronald Vazquez is a 74 year old male with history of hypertension who presents to the ED with hypertension.  Asymptomatic.  Blood pressure 180/90.  Just started new blood pressure medication today prescribed by primary care doctor.  Given reassurance as he is not having any severe chest pain or stroke symptoms.  Very well-appearing.  Recommend he stay patient with his course and follow-up with his primary care doctor.  Discharged in good condition.  Understands return precautions.  This chart was dictated using voice recognition software.  Despite best efforts to proofread,  errors can occur which can change the documentation meaning.    Final Clinical Impression(s) / ED Diagnoses Final diagnoses:  Hypertension, unspecified type    Rx / DC Orders ED Discharge Orders    None       Virgina Norfolk, DO 05/23/20 2242

## 2020-05-23 NOTE — ED Notes (Signed)
Pt stated he did not want to wait for dc paperwork. Pt stated that MD informed him to continue recently prescribed meds for HTN. Pt walked out of ED w/o issue.

## 2020-05-23 NOTE — ED Triage Notes (Addendum)
Hypertension. He has been taking his BP at home. No symptoms. He was seen here 4 days ago for hypertension. His MD changed his BP medication.

## 2020-11-24 IMAGING — DX DG CHEST 1V PORT
1 series · 1 of 1 positions shown · non-contrast
Comparison: None.

CLINICAL DATA: Cough and fever

EXAM:
PORTABLE CHEST 1 VIEW

[chest ap]
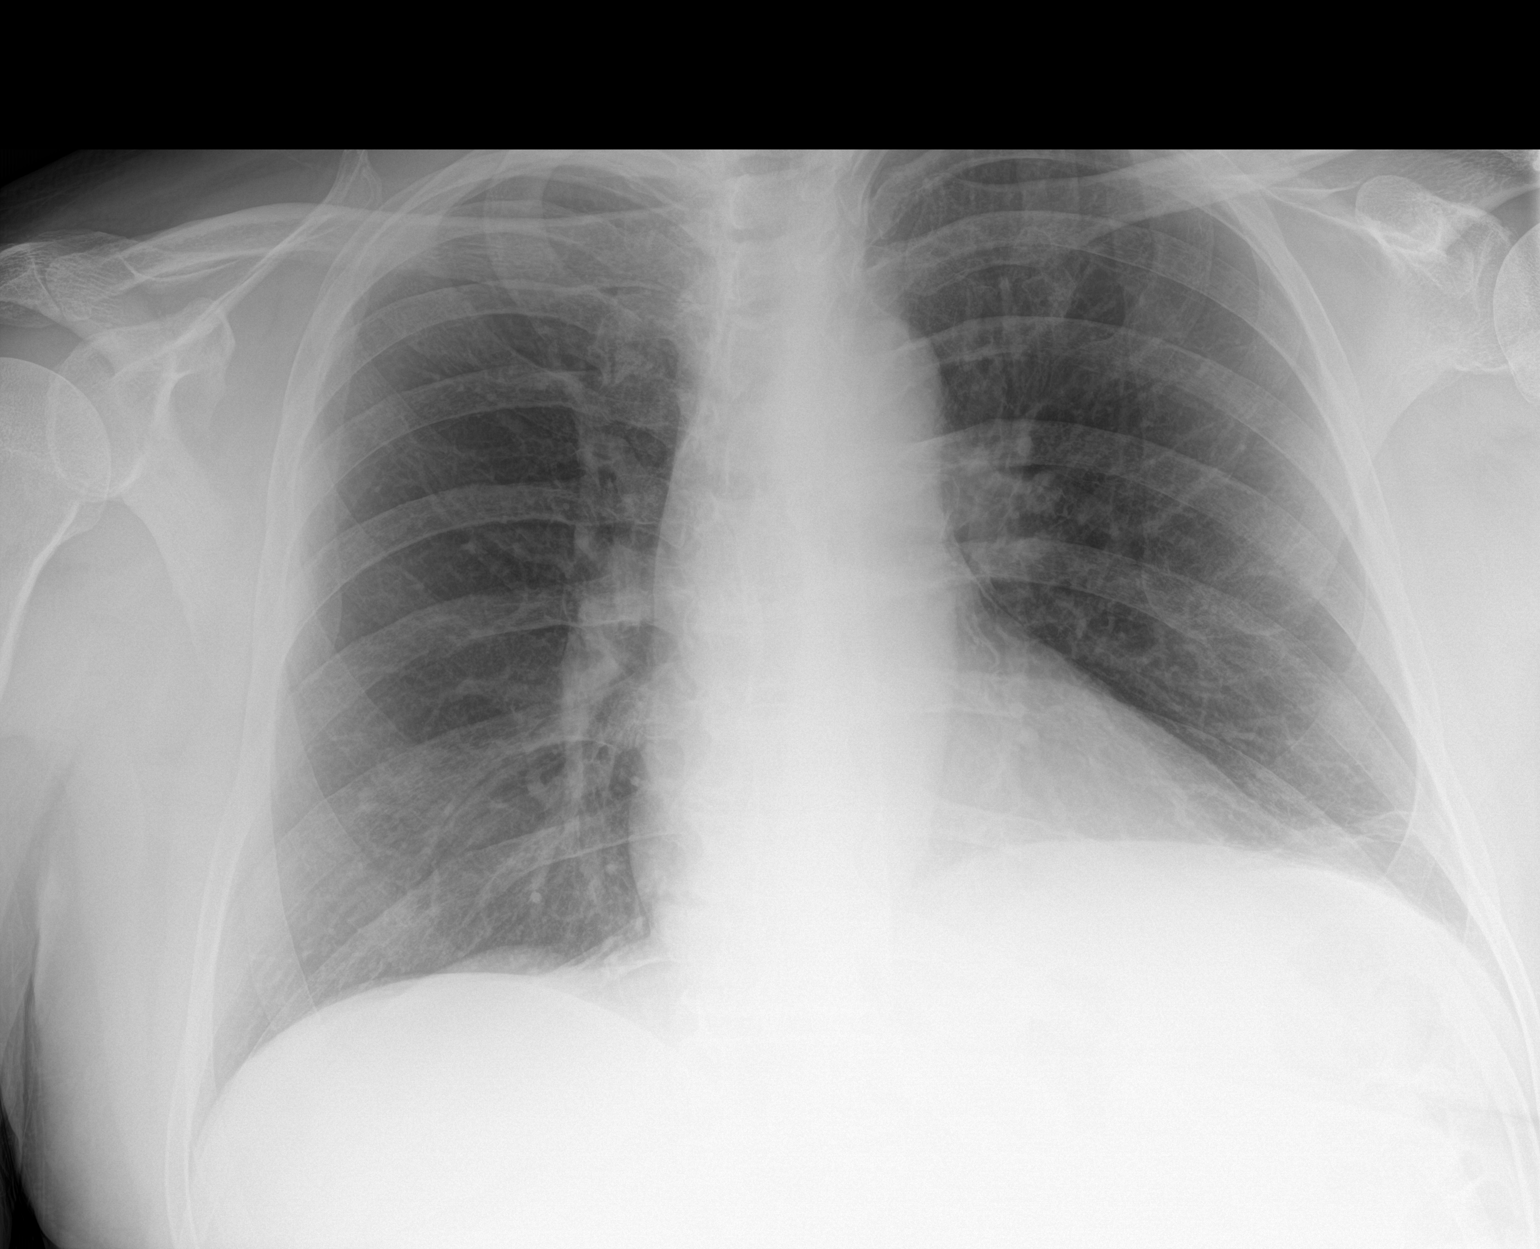

[1 of 1 positions shown; findings below may reference images not displayed]

FINDINGS: Mild linear opacity at the left base. Lung volumes are low. There is
no edema, consolidation, effusion, or pneumothorax. Normal heart
size and mediastinal contours.
IMPRESSION: Mild atelectasis at the left base.

## 2021-08-16 ENCOUNTER — Emergency Department (HOSPITAL_BASED_OUTPATIENT_CLINIC_OR_DEPARTMENT_OTHER): Payer: BC Managed Care – PPO

## 2021-08-16 ENCOUNTER — Encounter (HOSPITAL_BASED_OUTPATIENT_CLINIC_OR_DEPARTMENT_OTHER): Payer: Self-pay | Admitting: Emergency Medicine

## 2021-08-16 ENCOUNTER — Other Ambulatory Visit: Payer: Self-pay

## 2021-08-16 ENCOUNTER — Emergency Department (HOSPITAL_BASED_OUTPATIENT_CLINIC_OR_DEPARTMENT_OTHER)
Admission: EM | Admit: 2021-08-16 | Discharge: 2021-08-16 | Disposition: A | Discharge status: Home / Self Care | Payer: BC Managed Care – PPO | Attending: Emergency Medicine | Admitting: Emergency Medicine

## 2021-08-16 DIAGNOSIS — M7989 Other specified soft tissue disorders: Secondary | ICD-10-CM | POA: Diagnosis present

## 2021-08-16 DIAGNOSIS — Y9389 Activity, other specified: Secondary | ICD-10-CM | POA: Insufficient documentation

## 2021-08-16 DIAGNOSIS — M7021 Olecranon bursitis, right elbow: Secondary | ICD-10-CM | POA: Diagnosis not present

## 2021-08-16 DIAGNOSIS — L03113 Cellulitis of right upper limb: Secondary | ICD-10-CM | POA: Diagnosis not present

## 2021-08-16 MED ORDER — DOXYCYCLINE HYCLATE 100 MG PO TABS
100.0000 mg | ORAL_TABLET | Freq: Once | ORAL | Status: AC
  Administered 2021-08-16: 100 mg via ORAL
  Filled 2021-08-16: qty 1

## 2021-08-16 MED ORDER — LIDOCAINE-EPINEPHRINE (PF) 2 %-1:200000 IJ SOLN
10.0000 mL | Freq: Once | INTRAMUSCULAR | Status: AC
  Administered 2021-08-16: 10 mL
  Filled 2021-08-16: qty 20

## 2021-08-16 MED ORDER — BACITRACIN ZINC 500 UNIT/GM EX OINT
TOPICAL_OINTMENT | Freq: Once | CUTANEOUS | Status: AC
  Filled 2021-08-16: qty 28.35

## 2021-08-16 MED ORDER — DOXYCYCLINE HYCLATE 100 MG PO CAPS: 100.0000 mg | ORAL_CAPSULE | Freq: Two times a day (BID) | ORAL | 0 refills | Status: AC

## 2021-08-16 MED ORDER — HYDROCODONE-ACETAMINOPHEN 5-325 MG PO TABS: 1.0000 | ORAL_TABLET | ORAL | 0 refills | Status: AC | PRN

## 2021-08-16 NOTE — ED Notes (Signed)
Dressing applied to right elbow, pt tolerated well

## 2021-08-16 NOTE — ED Notes (Signed)
Assumed care from [facility]. Patient laying quietly on gurney. No acute distress noted. Patient updated on plan of care. Suture cart/ I&D tray along with medications placed at bedside. Will continue to monitor.

## 2021-08-16 NOTE — ED Notes (Signed)
No acute distress noted upon this RN's departure of patient. Verified discharge paperwork with name and DOB. Vital signs stable. Patient taken to checkout window. Discharge paperwork discussed with patient. Wound cleaned and dressed. Discussed with patient proper cleaning techniques. No further questions voiced upon discharge.

## 2021-08-16 NOTE — ED Triage Notes (Signed)
Fell 2 weeks ago , right elbow pain and swelling . On aspirin daily . Obvious redness and swelling , appears as skin infection

## 2021-08-16 NOTE — ED Provider Notes (Signed)
[facility] EMERGENCY DEPARTMENT Provider Note   CSN: [id number] Arrival date & time: 08/16/21  8657     History  Chief Complaint  Patient presents with   Arm Injury    Ronald Vazquez is a 76 y.o. male.  Pt is a 75 yo wm with a hx of htn and high cholesterol.  He said he tripped on the bleachers at his grandson's baseball game 2 weeks ago.  He hit his elbow, but did not have any significant pain.  Pt noticed some redness and swelling to his right elbow 1-2 days ago.  He said he had a hard time sleeping due to the pain.        Home Medications Prior to Admission medications   Medication Sig Start Date End Date Taking? Authorizing Provider  doxycycline (VIBRAMYCIN) 100 MG capsule Take 1 capsule (100 mg total) by mouth 2 (two) times daily. 08/16/21  Yes Jacalyn Lefevre, MD  HYDROcodone-acetaminophen (NORCO/VICODIN) 5-325 MG tablet Take 1 tablet by mouth every 4 (four) hours as needed. 08/16/21  Yes Jacalyn Lefevre, MD  metoprolol tartrate (LOPRESSOR) 25 MG tablet Take 1 tablet (25 mg total) by mouth 2 (two) times daily. 06/11/17   Loren Racer, MD  perindopril (ACEON) 4 MG tablet Take 1 tablet (4 mg total) by mouth daily. 05/19/20   Shanon Ace, PA-C      Allergies    Statins    Review of Systems   Review of Systems  Musculoskeletal:        Right elbow pain  All other systems reviewed and are negative.  Physical Exam Updated Vital Signs BP 122/79    Pulse 80    Temp 98.2 F (36.8 C) (Oral)    Resp 16    Wt 97.5 kg    SpO2 100%    BMI 29.16 kg/m  Physical Exam Vitals and nursing note reviewed.  Constitutional:      Appearance: Normal appearance.  HENT:     Head: Normocephalic and atraumatic.     Right Ear: External ear normal.     Left Ear: External ear normal.     Nose: Nose normal.     Mouth/Throat:     Mouth: Mucous membranes are moist.     Pharynx: Oropharynx is clear.  Eyes:     Extraocular Movements: Extraocular movements intact.      Conjunctiva/sclera: Conjunctivae normal.     Pupils: Pupils are equal, round, and reactive to light.  Cardiovascular:     Rate and Rhythm: Normal rate and regular rhythm.     Pulses: Normal pulses.     Heart sounds: Normal heart sounds.  Pulmonary:     Effort: Pulmonary effort is normal.     Breath sounds: Normal breath sounds.  Abdominal:     General: Abdomen is flat. Bowel sounds are normal.     Palpations: Abdomen is soft.  Musculoskeletal:     Cervical back: Normal range of motion and neck supple.     Comments: Right elbow bursitis with cellulitis  Skin:    General: Skin is warm.     Capillary Refill: Capillary refill takes less than 2 seconds.  Neurological:     General: No focal deficit present.     Mental Status: He is alert and oriented to person, place, and time.  Psychiatric:        Mood and Affect: Mood normal.        Behavior: Behavior normal.    ED Results /  Procedures / Treatments   Labs (all labs ordered are listed, but only abnormal results are displayed) Labs Reviewed - No data to display  EKG None  Radiology DG Elbow Complete Right  Result Date: 08/16/2021 CLINICAL DATA:  Fall approximately 2 weeks ago. Redness and swelling of the posterior elbow. EXAM: RIGHT ELBOW - COMPLETE 3+ VIEW COMPARISON:  None. FINDINGS: No fracture or dislocation. Soft tissue swelling overlying the olecranon is likely due to bursitis. IMPRESSION: Soft tissue swelling overlying the olecranon likely due to bursitis. No fracture or dislocation. Electronically Signed   By: Acquanetta Belling M.D.   On: 08/16/2021 10:30    Procedures .Marland KitchenIncision and Drainage  Date/Time: 08/16/2021 12:06 PM Performed by: Jacalyn Lefevre, MD Authorized by: Jacalyn Lefevre, MD   Consent:    Consent obtained:  Verbal   Consent given by:  Patient   Risks discussed:  Bleeding, incomplete drainage and pain   Alternatives discussed:  No treatment Universal protocol:    Procedure explained and questions  answered to patient or proxy's satisfaction: yes     Patient identity confirmed:  Verbally with patient Location:    Type:  Bursa   Size:  10   Location:  Upper extremity   Upper extremity location:  Elbow   Elbow location:  R elbow Pre-procedure details:    Skin preparation:  Povidone-iodine Sedation:    Sedation type:  None Anesthesia:    Anesthesia method:  Local infiltration   Local anesthetic:  Lidocaine 2% WITH epi Procedure type:    Complexity:  Simple Procedure details:    Incision types:  Single straight   Wound management:  Probed and deloculated   Drainage:  Serosanguinous   Drainage amount:  Moderate   Wound treatment:  Wound left open Post-procedure details:    Procedure completion:  Tolerated well, no immediate complications Comments:     Bursa fluid was not purulent    Medications Ordered in ED Medications  lidocaine-EPINEPHrine (XYLOCAINE W/EPI) 2 %-1:200000 (PF) injection 10 mL (10 mLs Infiltration Given by Other 08/16/21 1105)  bacitracin ointment ( Topical Given 08/16/21 1138)  doxycycline (VIBRA-TABS) tablet 100 mg (100 mg Oral Given 08/16/21 1138)    ED Course/ Medical Decision Making/ A&P                           Medical Decision Making Amount and/or Complexity of Data Reviewed Radiology: ordered.  Risk OTC drugs. Prescription drug management.   Pt's bursa was drained.  No obvious purulence.  He is placed on doxy due to the surrounding cellulitis.  He is to f/u with ortho if bursa reoccurs.  Pt is stable for d/c.  Return if worse.         Final Clinical Impression(s) / ED Diagnoses Final diagnoses:  Olecranon bursitis of right elbow  Cellulitis of right upper extremity    Rx / DC Orders ED Discharge Orders          Ordered    doxycycline (VIBRAMYCIN) 100 MG capsule  2 times daily        08/16/21 1122    HYDROcodone-acetaminophen (NORCO/VICODIN) 5-325 MG tablet  Every 4 hours PRN        08/16/21 1122               Jacalyn Lefevre, MD 08/16/21 1208

## 2021-08-18 ENCOUNTER — Emergency Department (HOSPITAL_BASED_OUTPATIENT_CLINIC_OR_DEPARTMENT_OTHER)
Admission: EM | Admit: 2021-08-18 | Discharge: 2021-08-18 | Disposition: A | Discharge status: Home / Self Care | Payer: BC Managed Care – PPO | Attending: Student | Admitting: Student

## 2021-08-18 ENCOUNTER — Other Ambulatory Visit: Payer: Self-pay

## 2021-08-18 ENCOUNTER — Encounter (HOSPITAL_BASED_OUTPATIENT_CLINIC_OR_DEPARTMENT_OTHER): Payer: Self-pay | Admitting: Emergency Medicine

## 2021-08-18 DIAGNOSIS — Y939 Activity, unspecified: Secondary | ICD-10-CM | POA: Insufficient documentation

## 2021-08-18 DIAGNOSIS — I10 Essential (primary) hypertension: Secondary | ICD-10-CM | POA: Diagnosis not present

## 2021-08-18 DIAGNOSIS — M7021 Olecranon bursitis, right elbow: Secondary | ICD-10-CM | POA: Diagnosis present

## 2021-08-18 DIAGNOSIS — Z79899 Other long term (current) drug therapy: Secondary | ICD-10-CM | POA: Insufficient documentation

## 2021-08-18 DIAGNOSIS — R110 Nausea: Secondary | ICD-10-CM | POA: Diagnosis not present

## 2021-08-18 MED ORDER — KETOROLAC TROMETHAMINE 30 MG/ML IJ SOLN
15.0000 mg | Freq: Once | INTRAMUSCULAR | Status: AC
  Administered 2021-08-18: 15 mg via INTRAMUSCULAR
  Filled 2021-08-18: qty 1

## 2021-08-18 MED ORDER — NAPROXEN 500 MG PO TABS: 500.0000 mg | ORAL_TABLET | Freq: Two times a day (BID) | ORAL | 0 refills | Status: AC

## 2021-08-18 NOTE — ED Provider Notes (Signed)
[facility] EMERGENCY DEPARTMENT Provider Note   CSN: [id number] Arrival date & time: 08/18/21  0263     History  Chief Complaint  Patient presents with   Joint Swelling    Ronald Vazquez is a 76 y.o. male.  The patient presents to the emergency department today complaining of increasing redness of the right elbow which was seen in the emergency department on Thursday for olecranon bursitis.  The patient has been taking doxycycline and hydrocodone at home.  He also endorsed nausea and a temperature of 99 degrees.  He states that he feels that the nausea may be from his medication.  The patient was given orthopedic referral information at his visit on Thursday but was also advised to return to the emergency department if the reddening worsened or if his pain increased. PMH significant for hypertension.  HPI     Home Medications Prior to Admission medications   Medication Sig Start Date End Date Taking? Authorizing Provider  doxycycline (VIBRAMYCIN) 100 MG capsule Take 1 capsule (100 mg total) by mouth 2 (two) times daily. 08/16/21  Yes Jacalyn Lefevre, MD  metoprolol tartrate (LOPRESSOR) 25 MG tablet Take 1 tablet (25 mg total) by mouth 2 (two) times daily. 06/11/17  Yes Loren Racer, MD  naproxen (NAPROSYN) 500 MG tablet Take 1 tablet (500 mg total) by mouth 2 (two) times daily. 08/18/21  Yes Barrie Dunker B, PA  perindopril (ACEON) 4 MG tablet Take 1 tablet (4 mg total) by mouth daily. 05/19/20  Yes Walisiewicz, Caroleen Hamman, PA-C  HYDROcodone-acetaminophen (NORCO/VICODIN) 5-325 MG tablet Take 1 tablet by mouth every 4 (four) hours as needed. 08/16/21   Jacalyn Lefevre, MD      Allergies    Statins    Review of Systems   Review of Systems  Respiratory:  Negative for shortness of breath.   Musculoskeletal:  Positive for arthralgias.   Physical Exam Updated Vital Signs BP 125/71 (BP Location: Left Arm)    Pulse 88    Temp 98.4 F (36.9 C) (Oral)    Resp 20    Ht 6'  (1.829 m)    Wt 97.5 kg    SpO2 98%    BMI 29.16 kg/m  Physical Exam Constitutional:      General: He is not in acute distress. HENT:     Head: Normocephalic.  Eyes:     Conjunctiva/sclera: Conjunctivae normal.  Musculoskeletal:        General: Normal range of motion.  Skin:    General: Skin is warm.     Findings: Erythema (Erythema at right elbow) present.  Neurological:     Mental Status: He is alert.    ED Results / Procedures / Treatments   Labs (all labs ordered are listed, but only abnormal results are displayed) Labs Reviewed - No data to display  EKG None  Radiology DG Elbow Complete Right  Result Date: 08/16/2021 CLINICAL DATA:  Fall approximately 2 weeks ago. Redness and swelling of the posterior elbow. EXAM: RIGHT ELBOW - COMPLETE 3+ VIEW COMPARISON:  None. FINDINGS: No fracture or dislocation. Soft tissue swelling overlying the olecranon is likely due to bursitis. IMPRESSION: Soft tissue swelling overlying the olecranon likely due to bursitis. No fracture or dislocation. Electronically Signed   By: Acquanetta Belling M.D.   On: 08/16/2021 10:30    Procedures Procedures    Medications Ordered in ED Medications  ketorolac (TORADOL) 30 MG/ML injection 15 mg (15 mg Intramuscular Given 08/18/21 0927)  ED Course/ Medical Decision Making/ A&P                           Medical Decision Making  The patient presents today complaining of right elbow pain. Differential diagnosis includes olecranon bursitis, septic arthritis, and others. The patient was diagnosed with bursitis on Thursday. His vital signs today show no sepsis criteria. He is currently on an antibiotic. I don't feel that lab work today would change patient management. I reviewed the note from 2/23 and reviewed the elbow x-ray from the same visit. No fracture or dislocation was noted, soft tissue swelling noted like due to bursitis. The patient has normal ROM in the right elbow. The area is not hot to the touch.  I see no reason for hospital admission at this time or for further imaging. The patient was administered IM Toradol and was placed in an ACE wrap for compression. I believe the patient can discharge home and follow up with orthopedics. Orthopedic contact information provided. I've also prescribed naproxen to help with inflammation. Return precautions provided.    Final Clinical Impression(s) / ED Diagnoses Final diagnoses:  Olecranon bursitis of right elbow    Rx / DC Orders ED Discharge Orders          Ordered    naproxen (NAPROSYN) 500 MG tablet  2 times daily        08/18/21 [place], [place], [place] 08/18/21 [id number]    Teressa Lower, MD 08/18/21 1454

## 2021-08-18 NOTE — ED Triage Notes (Signed)
Was eval here on Thursday and is returning for worsening swelling and redness to right elbow

## 2021-08-18 NOTE — Discharge Instructions (Addendum)
You were seen today for olecranon bursitis. You should continue the antibiotics that were prescribed at your visit on Thursday. Please finish the prescription. Your elbow was wrapped with a compression dressing today. This can be removed as needed. You were prescribed a medication called  Naprosyn for inflammation. I've provided the information to contact orthopedics for follow up.  Please call to schedule an appointment. Return to the emergency department if you develop life threats such as chest pain, shortness of breath, altered level of consciousness, or high fevers.

## 2021-12-03 ENCOUNTER — Emergency Department (HOSPITAL_BASED_OUTPATIENT_CLINIC_OR_DEPARTMENT_OTHER)
Admission: EM | Admit: 2021-12-03 | Discharge: 2021-12-03 | Discharge status: Against Medical Advice | Payer: Medicare Other

## 2021-12-15 ENCOUNTER — Emergency Department (HOSPITAL_BASED_OUTPATIENT_CLINIC_OR_DEPARTMENT_OTHER): Payer: BC Managed Care – PPO

## 2021-12-15 ENCOUNTER — Encounter (HOSPITAL_BASED_OUTPATIENT_CLINIC_OR_DEPARTMENT_OTHER): Payer: Self-pay | Admitting: Emergency Medicine

## 2021-12-15 ENCOUNTER — Other Ambulatory Visit: Payer: Self-pay

## 2021-12-15 ENCOUNTER — Emergency Department (HOSPITAL_BASED_OUTPATIENT_CLINIC_OR_DEPARTMENT_OTHER)
Admission: EM | Admit: 2021-12-15 | Discharge: 2021-12-15 | Disposition: A | Discharge status: Home / Self Care | Payer: BC Managed Care – PPO | Attending: Emergency Medicine | Admitting: Emergency Medicine

## 2021-12-15 DIAGNOSIS — M7531 Calcific tendinitis of right shoulder: Secondary | ICD-10-CM | POA: Insufficient documentation

## 2021-12-15 DIAGNOSIS — Y9389 Activity, other specified: Secondary | ICD-10-CM | POA: Insufficient documentation

## 2021-12-15 DIAGNOSIS — S40021A Contusion of right upper arm, initial encounter: Secondary | ICD-10-CM | POA: Diagnosis not present

## 2021-12-15 DIAGNOSIS — S46211A Strain of muscle, fascia and tendon of other parts of biceps, right arm, initial encounter: Secondary | ICD-10-CM

## 2021-12-15 DIAGNOSIS — X509XXA Other and unspecified overexertion or strenuous movements or postures, initial encounter: Secondary | ICD-10-CM | POA: Insufficient documentation

## 2021-12-15 DIAGNOSIS — S4991XA Unspecified injury of right shoulder and upper arm, initial encounter: Secondary | ICD-10-CM | POA: Diagnosis present

## 2021-12-15 LAB — COMPREHENSIVE METABOLIC PANEL
ALT: 32 U/L (ref 0–44)
AST: 26 U/L (ref 15–41)
Albumin: 4.4 g/dL (ref 3.5–5.0)
Alkaline Phosphatase: 61 U/L (ref 38–126)
Anion gap: 7 (ref 5–15)
BUN: 41 mg/dL — ABNORMAL HIGH (ref 8–23)
CO2: 28 mmol/L (ref 22–32)
Calcium: 9.5 mg/dL (ref 8.9–10.3)
Chloride: 104 mmol/L (ref 98–111)
Creatinine, Ser: 1.29 mg/dL — ABNORMAL HIGH (ref 0.61–1.24)
GFR, Estimated: 57 mL/min — ABNORMAL LOW (ref >60)
Glucose, Bld: 109 mg/dL — ABNORMAL HIGH (ref 70–99)
Potassium: 4 mmol/L (ref 3.5–5.1)
Sodium: 139 mmol/L (ref 135–145)
Total Bilirubin: 0.4 mg/dL (ref 0.3–1.2)
Total Protein: 7.4 g/dL (ref 6.5–8.1)

## 2021-12-15 LAB — CBC WITH DIFFERENTIAL/PLATELET
Abs Immature Granulocytes: 0.05 10*3/uL (ref 0.00–0.07)
Basophils Absolute: 0 10*3/uL (ref 0.0–0.1)
Basophils Relative: 0 %
Eosinophils Absolute: 0.1 10*3/uL (ref 0.0–0.5)
Eosinophils Relative: 1 %
HCT: 41.4 % (ref 39.0–52.0)
Hemoglobin: 13.8 g/dL (ref 13.0–17.0)
Immature Granulocytes: 0 %
Lymphocytes Relative: 17 %
Lymphs Abs: 2.2 10*3/uL (ref 0.7–4.0)
MCH: 30 pg (ref 26.0–34.0)
MCHC: 33.3 g/dL (ref 30.0–36.0)
MCV: 90 fL (ref 80.0–100.0)
Monocytes Absolute: 1.1 10*3/uL — ABNORMAL HIGH (ref 0.1–1.0)
Monocytes Relative: 8 %
Neutro Abs: 9.5 10*3/uL — ABNORMAL HIGH (ref 1.7–7.7)
Neutrophils Relative %: 74 %
Platelets: 423 10*3/uL — ABNORMAL HIGH (ref 150–400)
RBC: 4.6 MIL/uL (ref 4.22–5.81)
RDW: 14 % (ref 11.5–15.5)
WBC: 13.1 10*3/uL — ABNORMAL HIGH (ref 4.0–10.5)
nRBC: 0 % (ref 0.0–0.2)

## 2021-12-15 LAB — CK: Total CK: 318 U/L (ref 49–397)

## 2021-12-15 MED ORDER — IOHEXOL 300 MG/ML  SOLN
100.0000 mL | Freq: Once | INTRAMUSCULAR | Status: AC | PRN
  Administered 2021-12-15: 100 mL via INTRAVENOUS

## 2021-12-15 NOTE — ED Notes (Signed)
Patient returned from CT. Denies any needs at present.

## 2021-12-15 NOTE — ED Provider Notes (Signed)
[facility] EMERGENCY DEPARTMENT Provider Note   CSN: [id number] Arrival date & time: 12/15/21  1711     History  Chief Complaint  Patient presents with   Arm Injury    Ronald Vazquez is a 76 y.o. male here presenting with right upper arm swelling and bruising.  Patient was putting drinks in the refrigerator about a week ago and heard a pop in his bicep area.  He went to orthopedic doctor and was thought to have [facility] joint inflammation and had a steroid shot in the joint.  Patient states that he was doing fine until this morning.  He states that he woke up and he noticed ecchymosis on the right bicep area he denies any repeat injury or trauma to the area.  Denies any weakness.  The history is provided by the patient.       Home Medications Prior to Admission medications   Medication Sig Start Date End Date Taking? Authorizing Provider  doxycycline (VIBRAMYCIN) 100 MG capsule Take 1 capsule (100 mg total) by mouth 2 (two) times daily. 08/16/21   Jacalyn Lefevre, MD  HYDROcodone-acetaminophen (NORCO/VICODIN) 5-325 MG tablet Take 1 tablet by mouth every 4 (four) hours as needed. 08/16/21   Jacalyn Lefevre, MD  metoprolol tartrate (LOPRESSOR) 25 MG tablet Take 1 tablet (25 mg total) by mouth 2 (two) times daily. 06/11/17   Loren Racer, MD  naproxen (NAPROSYN) 500 MG tablet Take 1 tablet (500 mg total) by mouth 2 (two) times daily. 08/18/21   Darrick Grinder, PA-C  perindopril (ACEON) 4 MG tablet Take 1 tablet (4 mg total) by mouth daily. 05/19/20   Shanon Ace, PA-C      Allergies    Statins    Review of Systems   Review of Systems  Musculoskeletal:        Right biceps swelling   All other systems reviewed and are negative.   Physical Exam Updated Vital Signs BP 129/86 (BP Location: Left Arm)   Pulse 78   Temp 98.4 F (36.9 C) (Oral)   Resp 18   Ht 6' (1.829 m)   Wt 95.3 kg   SpO2 97%   BMI 28.48 kg/m  Physical Exam Vitals and nursing note  reviewed.  HENT:     Head: Normocephalic.     Nose: Nose normal.     Mouth/Throat:     Mouth: Mucous membranes are moist.  Eyes:     Extraocular Movements: Extraocular movements intact.     Pupils: Pupils are equal, round, and reactive to light.  Cardiovascular:     Rate and Rhythm: Normal rate.     Pulses: Normal pulses.  Pulmonary:     Effort: Pulmonary effort is normal.  Abdominal:     General: Abdomen is flat.  Musculoskeletal:     Cervical back: Normal range of motion.     Comments: Patient has ecchymosis of the right biceps tendon.  Patient is able to flex the biceps and has normal strength with biceps flexion.  Patient is able to do triceps extension.  There is some swelling of the right upper arm as well.  2+ brachial and radial pulses.  Patient has no obvious ecchymosis in the shoulder area from the joint injection  Skin:    General: Skin is warm.     Capillary Refill: Capillary refill takes less than 2 seconds.  Neurological:     General: No focal deficit present.     Mental Status: He is  alert and oriented to person, place, and time.  Psychiatric:        Behavior: Behavior normal.     ED Results / Procedures / Treatments   Labs (all labs ordered are listed, but only abnormal results are displayed) Labs Reviewed  CBC WITH DIFFERENTIAL/PLATELET - Abnormal; Notable for the following components:      Result Value   WBC 13.1 (*)    Platelets 423 (*)    Neutro Abs 9.5 (*)    Monocytes Absolute 1.1 (*)    All other components within normal limits  COMPREHENSIVE METABOLIC PANEL - Abnormal; Notable for the following components:   Glucose, Bld 109 (*)    BUN 41 (*)    Creatinine, Ser 1.29 (*)    GFR, Estimated 57 (*)    All other components within normal limits  CK    EKG None  Radiology CT HUMERUS RIGHT W CONTRAST  Result Date: 12/15/2021 CLINICAL DATA:  Soft tissue mass, upper arm, deep R biceps swelling and hematoma EXAM: CT OF THE UPPER RIGHT EXTREMITY  WITH CONTRAST TECHNIQUE: Multidetector CT imaging of the upper right extremity was performed according to the standard protocol following intravenous contrast administration. RADIATION DOSE REDUCTION: This exam was performed according to the departmental dose-optimization program which includes automated exposure control, adjustment of the mA and/or kV according to patient size and/or use of iterative reconstruction technique. CONTRAST:  OMNIPAQUE IOHEXOL 300 MG/ML  SOLN COMPARISON:  None Available. FINDINGS: Bones/Joint/Cartilage Normal alignment. No acute fracture or dislocation. 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 Minimal degenerative arthritis of the glenohumeral articulation. Mild to moderate degenerative arthritis of the ulnohumeral and radiocapitellar articulations. No focal lytic or blastic bone lesion identified. Ligaments Suboptimally assessed by CT. Muscles and Tendons Mild calcific tendinitis involving the insertion of the supraspinatus upon the greater tuberosity. Normal muscular bulk. Soft tissues There is mild infiltration surrounding the biceps musculature as well as mild infiltration of the overlying subcutaneous fat in keeping with edema or interstitial hemorrhage. No mass forming hematoma identified. IMPRESSION: 1. Mild infiltration surrounding the biceps musculature as well as mild infiltration of the overlying subcutaneous fat in keeping with edema or interstitial hemorrhage. No mass forming hematoma identified. 2. Mild calcific tendinitis involving the insertion of the supraspinatus upon the greater tuberosity. 3. 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 4. 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 Electronically Signed   By: Helyn Numbers M.D.   On: 12/15/2021 20:26   US Venous Img Upper Uni Right(DVT)  Result Date: 12/15/2021 CLINICAL DATA:  Swelling.  Injury. EXAM: RIGHT UPPER EXTREMITY VENOUS DOPPLER ULTRASOUND TECHNIQUE: Gray-scale sonography with graded compression, as well as color Doppler and duplex ultrasound were performed to evaluate the upper extremity deep venous system from the level of the subclavian vein and including the jugular, axillary, basilic, radial, ulnar and upper cephalic vein. Spectral Doppler was utilized to evaluate flow at rest and with distal augmentation maneuvers. COMPARISON:  None Available. FINDINGS: Contralateral Subclavian Vein: Respiratory phasicity is normal and symmetric with the symptomatic side. No evidence of thrombus. Normal compressibility. Internal Jugular Vein: No evidence of thrombus. Normal compressibility, respiratory phasicity and response to augmentation. Subclavian Vein: No evidence of thrombus. Normal compressibility, respiratory phasicity and response to augmentation. Axillary Vein: No evidence of thrombus. Normal compressibility, respiratory phasicity and response to  augmentation. Cephalic Vein: No evidence of thrombus. Normal compressibility, respiratory phasicity and response to augmentation. Basilic Vein: No evidence of thrombus. Normal compressibility, respiratory phasicity and response to augmentation. Brachial Veins: No evidence of thrombus. Normal compressibility, respiratory phasicity and response to augmentation. Radial Veins: No evidence of thrombus. Normal compressibility, respiratory phasicity and response to augmentation. Ulnar Veins: No evidence of thrombus. Normal compressibility, respiratory phasicity and response to augmentation. Venous Reflux:  None visualized. Other Findings:  None visualized. IMPRESSION: No evidence of DVT within the right upper extremity. Electronically Signed   By: Darliss Cheney M.D.   On: 12/15/2021 19:41    Procedures Procedures    Medications Ordered in ED Medications  iohexol (OMNIPAQUE) 300 MG/ML solution 100 mL (100 mLs Intravenous Contrast Given 12/15/21 1933)    ED Course/ Medical Decision Making/ A&P                           Medical Decision Making RINGO DONEGAN is a 76 y.o. male here presenting with right biceps ecchymosis.  I think likely hematoma from his recent muscle strain.  We will get DVT study to rule out DVT.  Also will get CT of the biceps to see the extent of the hematoma.   9:09 PM I reviewed patient's labs and independently reviewed imaging studies.  Labs showed normal CK level.  Patient's ultrasound showed no DVT.  CT showed biceps hemorrhage consistent with his recent muscle strain.  Patient has some calcific tendinitis of the supraspinatus tendon as well.  At this point I recommend ice and elevation.  Stable for discharge  Amount and/or Complexity of Data Reviewed Labs: ordered. Decision-making details documented in ED Course. Radiology: ordered and independent interpretation performed. Decision-making details documented in ED Course.  Risk Prescription drug management.    Final Clinical Impression(s) / ED Diagnoses Final diagnoses:  None    Rx / DC Orders ED Discharge Orders     None         Charlynne Pander, MD 12/15/21 2112

## 2022-12-16 IMAGING — CR DG ELBOW COMPLETE 3+V*R*
4 series · 4 of 4 positions shown · non-contrast
Comparison: None.

CLINICAL DATA: Fall approximately 2 weeks ago. Redness and swelling
of the posterior elbow.

EXAM:
RIGHT ELBOW - COMPLETE 3+ VIEW

[x elbow joint ap right]
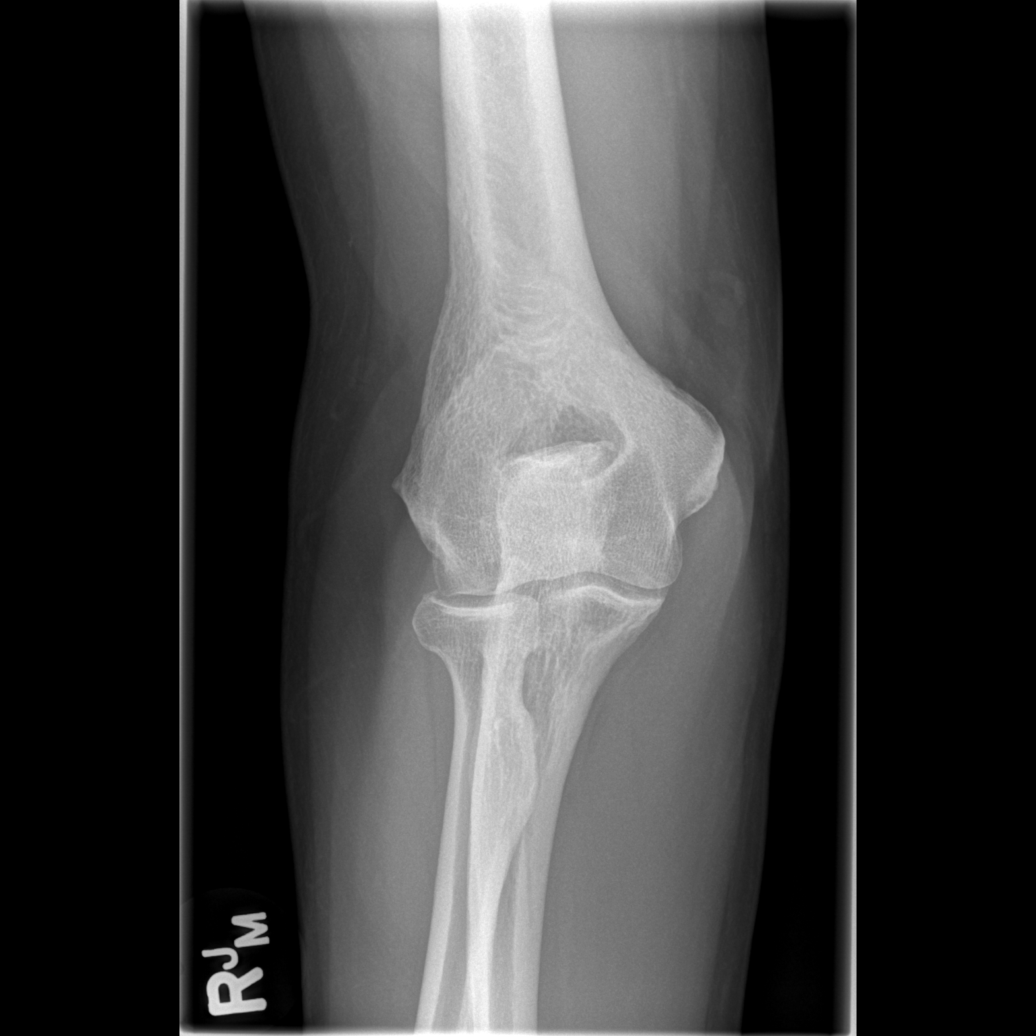

[x elbow joint obl. right (1 of 2)]
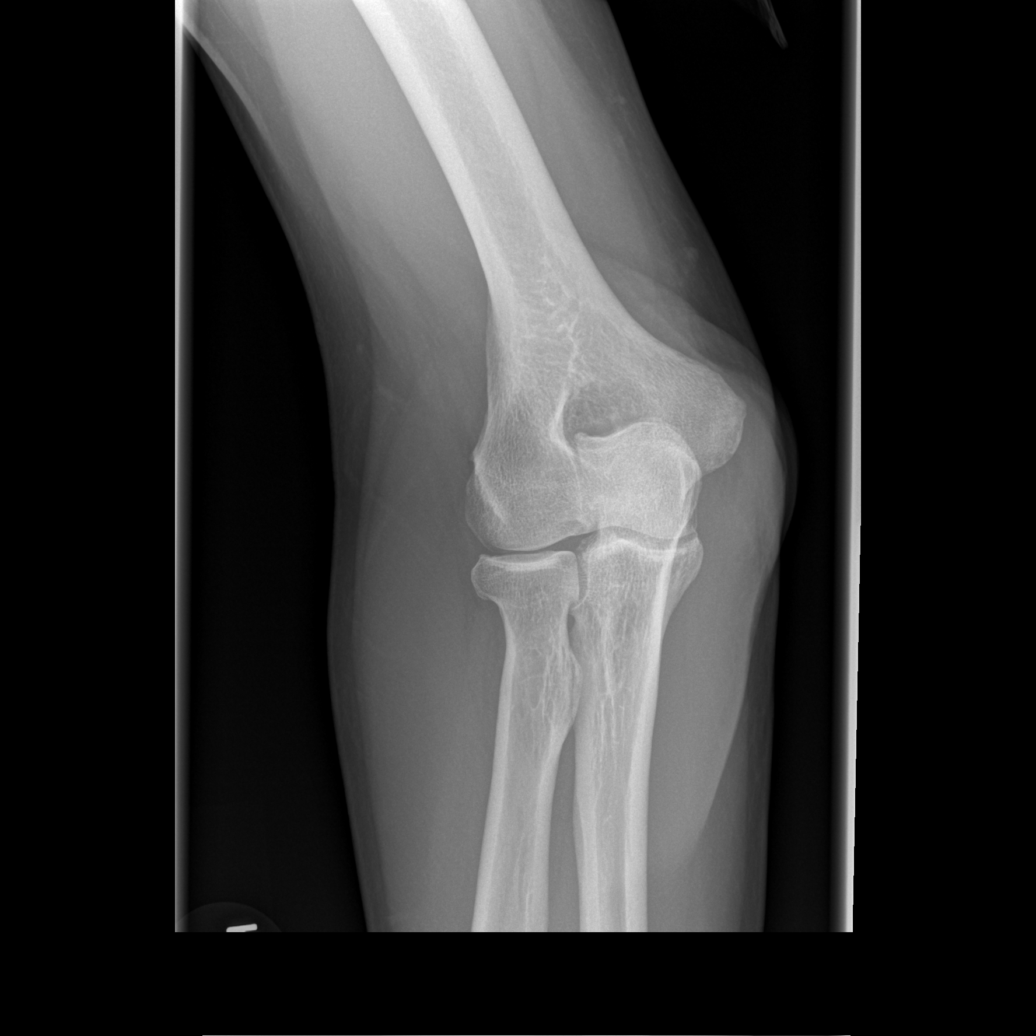

[x elbow joint obl. right (2 of 2)]
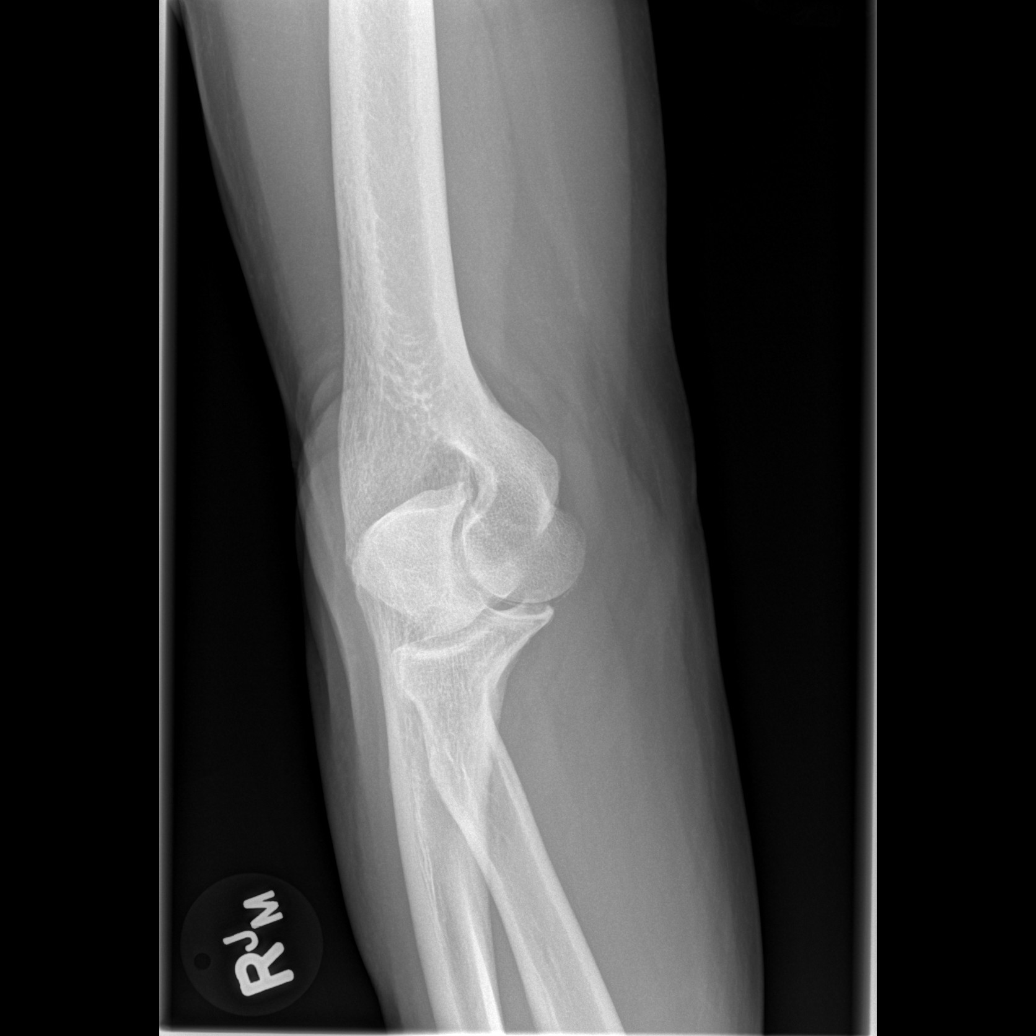

[x elbow joint lat right]
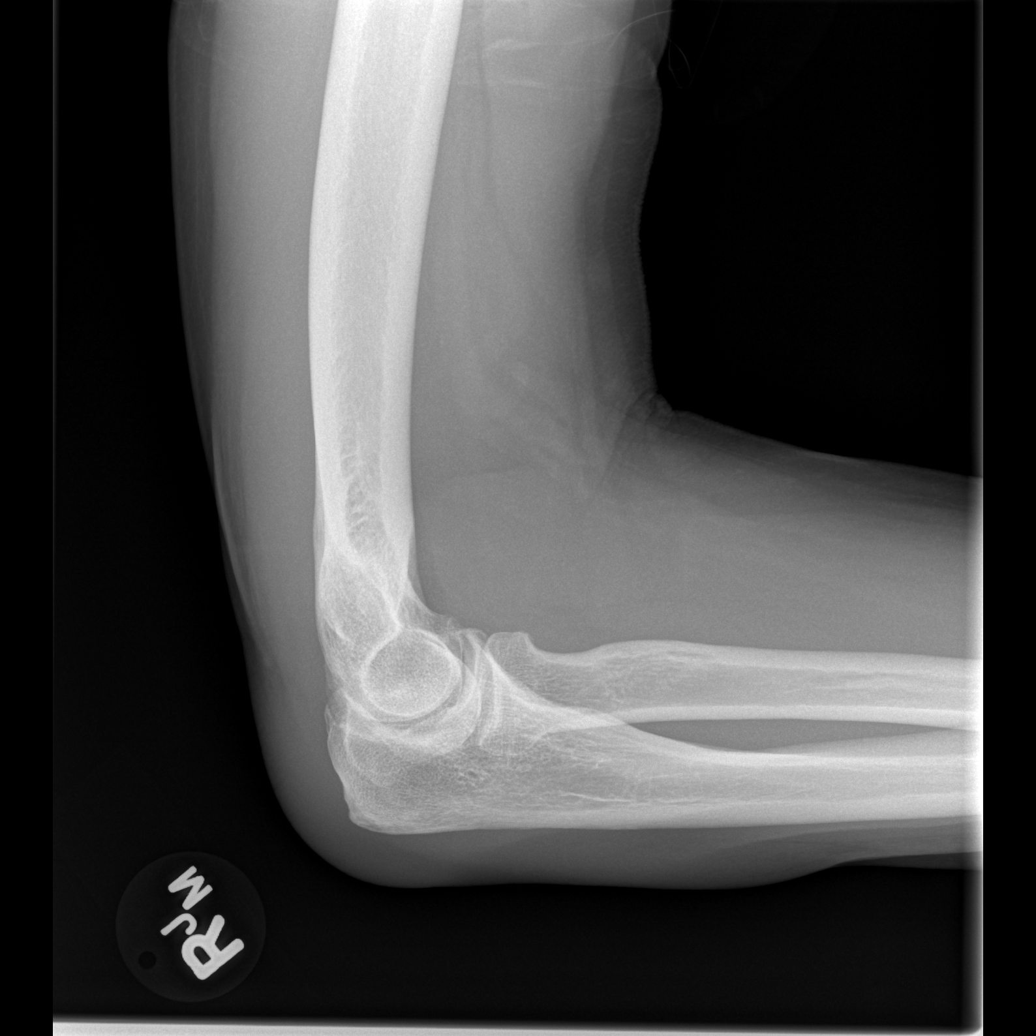

[4 of 4 positions shown; findings below may reference images not displayed]

FINDINGS: No fracture or dislocation.

Soft tissue swelling overlying the olecranon is likely due to
bursitis.
IMPRESSION: Soft tissue swelling overlying the olecranon likely due to bursitis.
No fracture or dislocation.
